# Patient Record
Sex: Male | Born: 1935 | Race: White | Hispanic: No | Smoking: Former smoker
Health system: Southern US, Community
[De-identification: ages and names within clinical notes are randomized; demographics above are authoritative.]

## PROBLEM LIST (undated history)

## (undated) DIAGNOSIS — C61 Malignant neoplasm of prostate: Secondary | ICD-10-CM

## (undated) DIAGNOSIS — I10 Essential (primary) hypertension: Secondary | ICD-10-CM

## (undated) DIAGNOSIS — I4891 Unspecified atrial fibrillation: Secondary | ICD-10-CM

## (undated) HISTORY — DX: Unspecified atrial fibrillation: I48.91

## (undated) HISTORY — PX: PROSTATECTOMY: SHX69

## (undated) HISTORY — DX: Essential (primary) hypertension: I10

## (undated) HISTORY — PX: HERNIA REPAIR: SHX51

## (undated) HISTORY — DX: Malignant neoplasm of prostate: C61

## (undated) HISTORY — PX: PROSTATE SURGERY: SHX751

---

## 2011-04-22 ENCOUNTER — Encounter (INDEPENDENT_AMBULATORY_CARE_PROVIDER_SITE_OTHER): Payer: Medicare Other | Admitting: Ophthalmology

## 2011-04-22 DIAGNOSIS — H348192 Central retinal vein occlusion, unspecified eye, stable: Secondary | ICD-10-CM

## 2011-04-22 DIAGNOSIS — H2510 Age-related nuclear cataract, unspecified eye: Secondary | ICD-10-CM

## 2011-04-22 DIAGNOSIS — H35039 Hypertensive retinopathy, unspecified eye: Secondary | ICD-10-CM

## 2011-04-22 DIAGNOSIS — H43819 Vitreous degeneration, unspecified eye: Secondary | ICD-10-CM

## 2011-06-17 ENCOUNTER — Ambulatory Visit (INDEPENDENT_AMBULATORY_CARE_PROVIDER_SITE_OTHER): Payer: Medicare Other | Admitting: Ophthalmology

## 2011-06-17 DIAGNOSIS — H35039 Hypertensive retinopathy, unspecified eye: Secondary | ICD-10-CM

## 2011-06-17 DIAGNOSIS — H348192 Central retinal vein occlusion, unspecified eye, stable: Secondary | ICD-10-CM

## 2011-06-17 DIAGNOSIS — H43819 Vitreous degeneration, unspecified eye: Secondary | ICD-10-CM

## 2011-06-17 DIAGNOSIS — H2510 Age-related nuclear cataract, unspecified eye: Secondary | ICD-10-CM

## 2011-09-09 ENCOUNTER — Encounter (INDEPENDENT_AMBULATORY_CARE_PROVIDER_SITE_OTHER): Payer: Medicare Other | Admitting: Ophthalmology

## 2011-09-09 DIAGNOSIS — H2510 Age-related nuclear cataract, unspecified eye: Secondary | ICD-10-CM

## 2011-09-09 DIAGNOSIS — H348192 Central retinal vein occlusion, unspecified eye, stable: Secondary | ICD-10-CM

## 2011-09-09 DIAGNOSIS — H43819 Vitreous degeneration, unspecified eye: Secondary | ICD-10-CM

## 2011-09-09 DIAGNOSIS — I10 Essential (primary) hypertension: Secondary | ICD-10-CM

## 2011-09-09 DIAGNOSIS — H35039 Hypertensive retinopathy, unspecified eye: Secondary | ICD-10-CM

## 2012-03-09 ENCOUNTER — Ambulatory Visit (INDEPENDENT_AMBULATORY_CARE_PROVIDER_SITE_OTHER): Payer: Medicare Other | Admitting: Ophthalmology

## 2014-10-31 DIAGNOSIS — H4011X1 Primary open-angle glaucoma, mild stage: Secondary | ICD-10-CM | POA: Diagnosis not present

## 2014-10-31 DIAGNOSIS — Z961 Presence of intraocular lens: Secondary | ICD-10-CM | POA: Diagnosis not present

## 2014-10-31 DIAGNOSIS — H4011X3 Primary open-angle glaucoma, severe stage: Secondary | ICD-10-CM | POA: Diagnosis not present

## 2014-11-14 DIAGNOSIS — L578 Other skin changes due to chronic exposure to nonionizing radiation: Secondary | ICD-10-CM | POA: Diagnosis not present

## 2014-11-14 DIAGNOSIS — L570 Actinic keratosis: Secondary | ICD-10-CM | POA: Diagnosis not present

## 2014-11-14 DIAGNOSIS — C44329 Squamous cell carcinoma of skin of other parts of face: Secondary | ICD-10-CM | POA: Diagnosis not present

## 2014-11-21 DIAGNOSIS — C44329 Squamous cell carcinoma of skin of other parts of face: Secondary | ICD-10-CM | POA: Diagnosis not present

## 2015-01-30 DIAGNOSIS — Z961 Presence of intraocular lens: Secondary | ICD-10-CM | POA: Diagnosis not present

## 2015-01-30 DIAGNOSIS — H4011X1 Primary open-angle glaucoma, mild stage: Secondary | ICD-10-CM | POA: Diagnosis not present

## 2015-01-30 DIAGNOSIS — H4011X3 Primary open-angle glaucoma, severe stage: Secondary | ICD-10-CM | POA: Diagnosis not present

## 2015-02-06 DIAGNOSIS — H4011X3 Primary open-angle glaucoma, severe stage: Secondary | ICD-10-CM | POA: Diagnosis not present

## 2015-02-06 DIAGNOSIS — H4089 Other specified glaucoma: Secondary | ICD-10-CM | POA: Diagnosis not present

## 2015-02-06 DIAGNOSIS — H4011X1 Primary open-angle glaucoma, mild stage: Secondary | ICD-10-CM | POA: Diagnosis not present

## 2015-02-07 DIAGNOSIS — E780 Pure hypercholesterolemia: Secondary | ICD-10-CM | POA: Diagnosis not present

## 2015-02-07 DIAGNOSIS — I4891 Unspecified atrial fibrillation: Secondary | ICD-10-CM | POA: Diagnosis not present

## 2015-02-07 DIAGNOSIS — Z79899 Other long term (current) drug therapy: Secondary | ICD-10-CM | POA: Diagnosis not present

## 2015-02-10 DIAGNOSIS — I4891 Unspecified atrial fibrillation: Secondary | ICD-10-CM | POA: Diagnosis not present

## 2015-02-14 DIAGNOSIS — I071 Rheumatic tricuspid insufficiency: Secondary | ICD-10-CM | POA: Diagnosis not present

## 2015-02-14 DIAGNOSIS — I340 Nonrheumatic mitral (valve) insufficiency: Secondary | ICD-10-CM | POA: Diagnosis not present

## 2015-02-14 DIAGNOSIS — I361 Nonrheumatic tricuspid (valve) insufficiency: Secondary | ICD-10-CM | POA: Diagnosis not present

## 2015-02-14 DIAGNOSIS — I351 Nonrheumatic aortic (valve) insufficiency: Secondary | ICD-10-CM | POA: Diagnosis not present

## 2015-02-14 DIAGNOSIS — I4891 Unspecified atrial fibrillation: Secondary | ICD-10-CM | POA: Diagnosis not present

## 2015-02-20 DIAGNOSIS — R972 Elevated prostate specific antigen [PSA]: Secondary | ICD-10-CM | POA: Diagnosis not present

## 2015-02-20 DIAGNOSIS — C61 Malignant neoplasm of prostate: Secondary | ICD-10-CM | POA: Diagnosis not present

## 2015-03-13 DIAGNOSIS — C44329 Squamous cell carcinoma of skin of other parts of face: Secondary | ICD-10-CM | POA: Diagnosis not present

## 2015-03-13 DIAGNOSIS — L570 Actinic keratosis: Secondary | ICD-10-CM | POA: Diagnosis not present

## 2015-03-13 DIAGNOSIS — L821 Other seborrheic keratosis: Secondary | ICD-10-CM | POA: Diagnosis not present

## 2015-03-13 DIAGNOSIS — I4891 Unspecified atrial fibrillation: Secondary | ICD-10-CM

## 2015-03-13 DIAGNOSIS — I10 Essential (primary) hypertension: Secondary | ICD-10-CM

## 2015-03-13 HISTORY — DX: Essential (primary) hypertension: I10

## 2015-03-13 HISTORY — DX: Unspecified atrial fibrillation: I48.91

## 2015-03-17 DIAGNOSIS — H4011X3 Primary open-angle glaucoma, severe stage: Secondary | ICD-10-CM | POA: Diagnosis not present

## 2015-03-17 DIAGNOSIS — H4089 Other specified glaucoma: Secondary | ICD-10-CM | POA: Diagnosis not present

## 2015-03-25 DIAGNOSIS — I10 Essential (primary) hypertension: Secondary | ICD-10-CM | POA: Diagnosis not present

## 2015-03-25 DIAGNOSIS — I4891 Unspecified atrial fibrillation: Secondary | ICD-10-CM | POA: Diagnosis not present

## 2015-03-31 DIAGNOSIS — I10 Essential (primary) hypertension: Secondary | ICD-10-CM | POA: Diagnosis not present

## 2015-03-31 DIAGNOSIS — I4891 Unspecified atrial fibrillation: Secondary | ICD-10-CM | POA: Diagnosis not present

## 2015-05-21 DIAGNOSIS — Z23 Encounter for immunization: Secondary | ICD-10-CM | POA: Diagnosis not present

## 2015-06-12 DIAGNOSIS — R972 Elevated prostate specific antigen [PSA]: Secondary | ICD-10-CM | POA: Diagnosis not present

## 2015-06-12 DIAGNOSIS — C61 Malignant neoplasm of prostate: Secondary | ICD-10-CM | POA: Diagnosis not present

## 2015-07-23 DIAGNOSIS — Z0000 Encounter for general adult medical examination without abnormal findings: Secondary | ICD-10-CM | POA: Diagnosis not present

## 2015-07-23 DIAGNOSIS — E785 Hyperlipidemia, unspecified: Secondary | ICD-10-CM | POA: Diagnosis not present

## 2015-08-01 DIAGNOSIS — Z1389 Encounter for screening for other disorder: Secondary | ICD-10-CM | POA: Diagnosis not present

## 2015-08-01 DIAGNOSIS — Z0000 Encounter for general adult medical examination without abnormal findings: Secondary | ICD-10-CM | POA: Diagnosis not present

## 2015-08-01 DIAGNOSIS — Z9181 History of falling: Secondary | ICD-10-CM | POA: Diagnosis not present

## 2015-08-07 DIAGNOSIS — Z961 Presence of intraocular lens: Secondary | ICD-10-CM | POA: Diagnosis not present

## 2015-08-07 DIAGNOSIS — H401123 Primary open-angle glaucoma, left eye, severe stage: Secondary | ICD-10-CM | POA: Diagnosis not present

## 2015-08-07 DIAGNOSIS — H401111 Primary open-angle glaucoma, right eye, mild stage: Secondary | ICD-10-CM | POA: Diagnosis not present

## 2017-03-24 ENCOUNTER — Ambulatory Visit (INDEPENDENT_AMBULATORY_CARE_PROVIDER_SITE_OTHER): Payer: Medicare Other | Admitting: Cardiology

## 2017-03-24 ENCOUNTER — Encounter: Payer: Self-pay | Admitting: Cardiology

## 2017-03-24 VITALS — BP 110/66 | HR 69 | Ht 70.0 in | Wt 172.4 lb

## 2017-03-24 DIAGNOSIS — I10 Essential (primary) hypertension: Secondary | ICD-10-CM | POA: Diagnosis not present

## 2017-03-24 DIAGNOSIS — I482 Chronic atrial fibrillation, unspecified: Secondary | ICD-10-CM

## 2017-03-24 NOTE — Progress Notes (Signed)
Cardiology Office Note:    Date:  03/24/2017   ID:  Justin Hull, DOB 12-07-1929, MRN [id number]  PCP:  Street, Sharon Mt, MD  Cardiologist:  Jenean Lindau, MD   Referring MD: [address] Sharon Mt, *    ASSESSMENT:    1. Chronic atrial fibrillation ([place])   2. Essential hypertension    PLAN:    In order of problems listed above:  1. I discussed with the patient atrial fibrillation, disease process. Management and therapy including rate and rhythm control, anticoagulation benefits and potential risks were discussed extensively with the patient. Patient had multiple questions which were answered to patient's satisfaction. 2. Patient and his arrhythmia condition is stable with the patient is asymptomatic with excellent effort tolerance therefore we will not make any changes during this visit. 3. His blood pressure stable. His lipids and by his primary care physician. Diet was discussed he will be seen in follow-up appointment in 6 months or earlier if he has any concerns.   Medication Adjustments/Labs and Tests Ordered: Current medicines are reviewed at length with the patient today.  Concerns regarding medicines are outlined above.  No orders of the defined types were placed in this encounter.  No orders of the defined types were placed in this encounter.    History of Present Illness:    Justin Hull is a 83 y.o. male who is being seen today for the evaluation of Atrial fibrillation at the request of Street, Sharon Mt, *. The patient has been evaluated and taken care of by me in my previous practice and he has transferred his care and wants to be established with this practice. He is an amazingly healthy for his age is a fit gentleman who appears younger than stated age. He denies any chest pain orthopnea or PND. No palpitations or any dizziness his exercise capacity is excellent and at bedtime atrial fibrillation is asymptomatic. At the time of my evaluation is alert awake  oriented and in no distress  Past Medical History:  Diagnosis Date  . Atrial fibrillation ([place]) 03/13/2015  . Essential hypertension 03/13/2015    Past Surgical History:  Procedure Laterality Date  . PROSTATE SURGERY      Current Medications: Current Meds  Medication Sig  . amLODipine (NORVASC) 2.5 MG tablet Take 2.5 mg by mouth daily.  Marland Kitchen apixaban (ELIQUIS) 5 MG TABS tablet Take 5 mg by mouth 2 (two) times daily.  . benazepril (LOTENSIN) 40 MG tablet Take 40 mg by mouth daily.  . bicalutamide (CASODEX) 50 MG tablet Take 50 mg by mouth daily.  . Cholecalciferol (VITAMIN D3) 1000 units CAPS Take 1,000 Units by mouth daily.  . COMBIGAN 0.2-0.5 % ophthalmic solution instil 1 drop in right eye twice a day  . prednisoLONE acetate (PRED FORTE) 1 % ophthalmic suspension Administer 1 drop into the left eye daily at 0600.  Marland Kitchen vitamin E 100 UNIT capsule Take 100 Units by mouth daily.     Allergies:   Patient has no known allergies.   Social History   Social History  . Marital status: Unknown    Spouse name: N/A  . Number of children: N/A  . Years of education: N/A   Social History Main Topics  . Smoking status: Current Some Day Smoker    Types: Cigars  . Smokeless tobacco: Former Systems developer     Comment: smokes cigars when playing golf  . Alcohol use 0.6 oz/week    1 Cans of beer per week  .  Drug use: No  . Sexual activity: Not Asked   Other Topics Concern  . None   Social History Narrative  . None     Family History: The patient's family history includes Heart attack in his brother and father; Heart disease in his brother.  ROS:   Please see the history of present illness.    All other systems reviewed and are negative.  EKGs/Labs/Other Studies Reviewed:    The following studies were reviewed today: I reviewed her records from previous office visits and discussed and answered his questions. EKG reveals atrial fibrillation with well-controlled ventricular rate and  nonspecific ST-T changes.   Recent Labs: No results found for requested labs within last 8760 hours.  Recent Lipid Panel No results found for: CHOL, TRIG, HDL, CHOLHDL, VLDL, LDLCALC, LDLDIRECT  Physical Exam:    VS:  BP 110/66   Pulse 69   Ht 5\' 10"  (1.778 m)   Wt 172 lb 6.4 oz (78.2 kg)   SpO2 95%   BMI 24.74 kg/m     Wt Readings from Last 3 Encounters:  03/24/17 172 lb 6.4 oz (78.2 kg)     GEN: Patient is in no acute distress HEENT: Normal NECK: No JVD; No carotid bruits LYMPHATICS: No lymphadenopathy CARDIAC: S1 S2 regular, 2/6 systolic murmur at the apex. RESPIRATORY:  Clear to auscultation without rales, wheezing or rhonchi  ABDOMEN: Soft, non-tender, non-distended MUSCULOSKELETAL:  No edema; No deformity  SKIN: Warm and dry NEUROLOGIC:  Alert and oriented x 3 PSYCHIATRIC:  Normal affect    Signed, Jenean Lindau, MD  03/24/2017 9:11 AM    [place]

## 2017-04-14 DIAGNOSIS — Z961 Presence of intraocular lens: Secondary | ICD-10-CM | POA: Diagnosis not present

## 2017-04-14 DIAGNOSIS — H401123 Primary open-angle glaucoma, left eye, severe stage: Secondary | ICD-10-CM | POA: Diagnosis not present

## 2017-04-14 DIAGNOSIS — H401111 Primary open-angle glaucoma, right eye, mild stage: Secondary | ICD-10-CM | POA: Diagnosis not present

## 2017-06-08 DIAGNOSIS — C61 Malignant neoplasm of prostate: Secondary | ICD-10-CM | POA: Diagnosis not present

## 2017-06-08 DIAGNOSIS — N3289 Other specified disorders of bladder: Secondary | ICD-10-CM | POA: Diagnosis not present

## 2017-06-08 DIAGNOSIS — Z7689 Persons encountering health services in other specified circumstances: Secondary | ICD-10-CM | POA: Diagnosis not present

## 2017-06-15 DIAGNOSIS — D485 Neoplasm of uncertain behavior of skin: Secondary | ICD-10-CM | POA: Diagnosis not present

## 2017-06-15 DIAGNOSIS — L578 Other skin changes due to chronic exposure to nonionizing radiation: Secondary | ICD-10-CM | POA: Diagnosis not present

## 2017-06-15 DIAGNOSIS — L570 Actinic keratosis: Secondary | ICD-10-CM | POA: Diagnosis not present

## 2017-06-15 DIAGNOSIS — L821 Other seborrheic keratosis: Secondary | ICD-10-CM | POA: Diagnosis not present

## 2017-06-27 DIAGNOSIS — Z23 Encounter for immunization: Secondary | ICD-10-CM | POA: Diagnosis not present

## 2017-08-19 DIAGNOSIS — E785 Hyperlipidemia, unspecified: Secondary | ICD-10-CM | POA: Diagnosis not present

## 2017-08-19 DIAGNOSIS — I129 Hypertensive chronic kidney disease with stage 1 through stage 4 chronic kidney disease, or unspecified chronic kidney disease: Secondary | ICD-10-CM | POA: Diagnosis not present

## 2017-08-19 DIAGNOSIS — R739 Hyperglycemia, unspecified: Secondary | ICD-10-CM | POA: Diagnosis not present

## 2017-08-19 DIAGNOSIS — Z0000 Encounter for general adult medical examination without abnormal findings: Secondary | ICD-10-CM | POA: Diagnosis not present

## 2017-08-19 DIAGNOSIS — I10 Essential (primary) hypertension: Secondary | ICD-10-CM | POA: Diagnosis not present

## 2017-08-30 HISTORY — PX: BUCCAL MASS EXCISION: SHX1272

## 2017-10-26 DIAGNOSIS — H401123 Primary open-angle glaucoma, left eye, severe stage: Secondary | ICD-10-CM | POA: Diagnosis not present

## 2017-10-26 DIAGNOSIS — H401111 Primary open-angle glaucoma, right eye, mild stage: Secondary | ICD-10-CM | POA: Diagnosis not present

## 2017-10-26 DIAGNOSIS — Z961 Presence of intraocular lens: Secondary | ICD-10-CM | POA: Diagnosis not present

## 2017-12-08 DIAGNOSIS — N3289 Other specified disorders of bladder: Secondary | ICD-10-CM | POA: Diagnosis not present

## 2017-12-08 DIAGNOSIS — C61 Malignant neoplasm of prostate: Secondary | ICD-10-CM | POA: Diagnosis not present

## 2018-04-27 DIAGNOSIS — H40011 Open angle with borderline findings, low risk, right eye: Secondary | ICD-10-CM | POA: Diagnosis not present

## 2018-04-27 DIAGNOSIS — H401123 Primary open-angle glaucoma, left eye, severe stage: Secondary | ICD-10-CM | POA: Diagnosis not present

## 2018-04-27 DIAGNOSIS — Z961 Presence of intraocular lens: Secondary | ICD-10-CM | POA: Diagnosis not present

## 2018-06-08 DIAGNOSIS — N3289 Other specified disorders of bladder: Secondary | ICD-10-CM | POA: Diagnosis not present

## 2018-06-16 DIAGNOSIS — K122 Cellulitis and abscess of mouth: Secondary | ICD-10-CM | POA: Diagnosis not present

## 2018-06-28 DIAGNOSIS — K122 Cellulitis and abscess of mouth: Secondary | ICD-10-CM | POA: Diagnosis not present

## 2018-06-28 DIAGNOSIS — Z6824 Body mass index (BMI) 24.0-24.9, adult: Secondary | ICD-10-CM | POA: Diagnosis not present

## 2018-07-06 DIAGNOSIS — C090 Malignant neoplasm of tonsillar fossa: Secondary | ICD-10-CM | POA: Diagnosis not present

## 2018-07-10 DIAGNOSIS — C060 Malignant neoplasm of cheek mucosa: Secondary | ICD-10-CM | POA: Diagnosis not present

## 2018-07-11 DIAGNOSIS — C060 Malignant neoplasm of cheek mucosa: Secondary | ICD-10-CM | POA: Diagnosis not present

## 2018-07-11 DIAGNOSIS — I482 Chronic atrial fibrillation, unspecified: Secondary | ICD-10-CM | POA: Diagnosis not present

## 2018-07-11 DIAGNOSIS — R490 Dysphonia: Secondary | ICD-10-CM | POA: Diagnosis not present

## 2018-07-12 DIAGNOSIS — C090 Malignant neoplasm of tonsillar fossa: Secondary | ICD-10-CM | POA: Diagnosis not present

## 2018-07-19 DIAGNOSIS — Z01810 Encounter for preprocedural cardiovascular examination: Secondary | ICD-10-CM | POA: Diagnosis not present

## 2018-07-19 DIAGNOSIS — I4891 Unspecified atrial fibrillation: Secondary | ICD-10-CM | POA: Diagnosis not present

## 2018-07-24 DIAGNOSIS — C060 Malignant neoplasm of cheek mucosa: Secondary | ICD-10-CM | POA: Diagnosis not present

## 2018-07-24 DIAGNOSIS — C770 Secondary and unspecified malignant neoplasm of lymph nodes of head, face and neck: Secondary | ICD-10-CM | POA: Diagnosis not present

## 2018-07-24 DIAGNOSIS — I482 Chronic atrial fibrillation, unspecified: Secondary | ICD-10-CM | POA: Diagnosis not present

## 2018-08-10 DIAGNOSIS — C7989 Secondary malignant neoplasm of other specified sites: Secondary | ICD-10-CM | POA: Diagnosis not present

## 2018-08-10 DIAGNOSIS — C44329 Squamous cell carcinoma of skin of other parts of face: Secondary | ICD-10-CM | POA: Diagnosis not present

## 2018-08-10 DIAGNOSIS — C069 Malignant neoplasm of mouth, unspecified: Secondary | ICD-10-CM | POA: Diagnosis not present

## 2018-08-10 DIAGNOSIS — Z7901 Long term (current) use of anticoagulants: Secondary | ICD-10-CM | POA: Diagnosis not present

## 2018-08-10 DIAGNOSIS — I10 Essential (primary) hypertension: Secondary | ICD-10-CM | POA: Diagnosis not present

## 2018-08-10 DIAGNOSIS — M7989 Other specified soft tissue disorders: Secondary | ICD-10-CM | POA: Diagnosis not present

## 2018-08-10 DIAGNOSIS — R252 Cramp and spasm: Secondary | ICD-10-CM | POA: Diagnosis not present

## 2018-08-10 DIAGNOSIS — C770 Secondary and unspecified malignant neoplasm of lymph nodes of head, face and neck: Secondary | ICD-10-CM | POA: Diagnosis not present

## 2018-08-10 DIAGNOSIS — M79662 Pain in left lower leg: Secondary | ICD-10-CM | POA: Diagnosis not present

## 2018-08-10 DIAGNOSIS — H5462 Unqualified visual loss, left eye, normal vision right eye: Secondary | ICD-10-CM | POA: Diagnosis not present

## 2018-08-10 DIAGNOSIS — K0889 Other specified disorders of teeth and supporting structures: Secondary | ICD-10-CM | POA: Diagnosis not present

## 2018-08-10 DIAGNOSIS — I482 Chronic atrial fibrillation, unspecified: Secondary | ICD-10-CM | POA: Diagnosis not present

## 2018-08-10 DIAGNOSIS — Z87891 Personal history of nicotine dependence: Secondary | ICD-10-CM | POA: Diagnosis not present

## 2018-08-10 DIAGNOSIS — C792 Secondary malignant neoplasm of skin: Secondary | ICD-10-CM | POA: Diagnosis not present

## 2018-08-10 DIAGNOSIS — R05 Cough: Secondary | ICD-10-CM | POA: Diagnosis not present

## 2018-08-10 DIAGNOSIS — K05322 Chronic periodontitis, generalized, moderate: Secondary | ICD-10-CM | POA: Diagnosis not present

## 2018-08-10 DIAGNOSIS — C060 Malignant neoplasm of cheek mucosa: Secondary | ICD-10-CM | POA: Diagnosis not present

## 2018-08-10 DIAGNOSIS — Z79899 Other long term (current) drug therapy: Secondary | ICD-10-CM | POA: Diagnosis not present

## 2018-08-10 DIAGNOSIS — R1314 Dysphagia, pharyngoesophageal phase: Secondary | ICD-10-CM | POA: Diagnosis not present

## 2018-09-06 DIAGNOSIS — I129 Hypertensive chronic kidney disease with stage 1 through stage 4 chronic kidney disease, or unspecified chronic kidney disease: Secondary | ICD-10-CM | POA: Diagnosis not present

## 2018-09-06 DIAGNOSIS — E785 Hyperlipidemia, unspecified: Secondary | ICD-10-CM | POA: Diagnosis not present

## 2018-09-06 DIAGNOSIS — I10 Essential (primary) hypertension: Secondary | ICD-10-CM | POA: Diagnosis not present

## 2018-09-06 DIAGNOSIS — C069 Malignant neoplasm of mouth, unspecified: Secondary | ICD-10-CM | POA: Diagnosis not present

## 2018-09-06 DIAGNOSIS — Z0000 Encounter for general adult medical examination without abnormal findings: Secondary | ICD-10-CM | POA: Diagnosis not present

## 2018-09-06 DIAGNOSIS — Z79899 Other long term (current) drug therapy: Secondary | ICD-10-CM | POA: Diagnosis not present

## 2018-09-27 DIAGNOSIS — C060 Malignant neoplasm of cheek mucosa: Secondary | ICD-10-CM | POA: Diagnosis not present

## 2018-11-01 DIAGNOSIS — H401123 Primary open-angle glaucoma, left eye, severe stage: Secondary | ICD-10-CM | POA: Diagnosis not present

## 2018-11-01 DIAGNOSIS — Z961 Presence of intraocular lens: Secondary | ICD-10-CM | POA: Diagnosis not present

## 2018-11-01 DIAGNOSIS — H02834 Dermatochalasis of left upper eyelid: Secondary | ICD-10-CM | POA: Diagnosis not present

## 2018-11-01 DIAGNOSIS — H401111 Primary open-angle glaucoma, right eye, mild stage: Secondary | ICD-10-CM | POA: Diagnosis not present

## 2018-11-01 DIAGNOSIS — H02831 Dermatochalasis of right upper eyelid: Secondary | ICD-10-CM | POA: Diagnosis not present

## 2019-01-12 DIAGNOSIS — C770 Secondary and unspecified malignant neoplasm of lymph nodes of head, face and neck: Secondary | ICD-10-CM | POA: Diagnosis not present

## 2019-01-12 DIAGNOSIS — C060 Malignant neoplasm of cheek mucosa: Secondary | ICD-10-CM | POA: Diagnosis not present

## 2019-01-18 DIAGNOSIS — Z7689 Persons encountering health services in other specified circumstances: Secondary | ICD-10-CM | POA: Diagnosis not present

## 2019-01-18 DIAGNOSIS — N3289 Other specified disorders of bladder: Secondary | ICD-10-CM | POA: Diagnosis not present

## 2019-04-30 DIAGNOSIS — I129 Hypertensive chronic kidney disease with stage 1 through stage 4 chronic kidney disease, or unspecified chronic kidney disease: Secondary | ICD-10-CM | POA: Diagnosis not present

## 2019-04-30 DIAGNOSIS — I10 Essential (primary) hypertension: Secondary | ICD-10-CM | POA: Diagnosis not present

## 2019-04-30 DIAGNOSIS — E785 Hyperlipidemia, unspecified: Secondary | ICD-10-CM | POA: Diagnosis not present

## 2019-05-02 DIAGNOSIS — H401123 Primary open-angle glaucoma, left eye, severe stage: Secondary | ICD-10-CM | POA: Diagnosis not present

## 2019-05-02 DIAGNOSIS — H401111 Primary open-angle glaucoma, right eye, mild stage: Secondary | ICD-10-CM | POA: Diagnosis not present

## 2019-05-30 DIAGNOSIS — E785 Hyperlipidemia, unspecified: Secondary | ICD-10-CM | POA: Diagnosis not present

## 2019-05-30 DIAGNOSIS — H401111 Primary open-angle glaucoma, right eye, mild stage: Secondary | ICD-10-CM | POA: Diagnosis not present

## 2019-05-30 DIAGNOSIS — I129 Hypertensive chronic kidney disease with stage 1 through stage 4 chronic kidney disease, or unspecified chronic kidney disease: Secondary | ICD-10-CM | POA: Diagnosis not present

## 2019-05-30 DIAGNOSIS — I10 Essential (primary) hypertension: Secondary | ICD-10-CM | POA: Diagnosis not present

## 2019-05-30 DIAGNOSIS — H401123 Primary open-angle glaucoma, left eye, severe stage: Secondary | ICD-10-CM | POA: Diagnosis not present

## 2019-07-23 DIAGNOSIS — Z79899 Other long term (current) drug therapy: Secondary | ICD-10-CM | POA: Diagnosis not present

## 2019-07-24 DIAGNOSIS — Z23 Encounter for immunization: Secondary | ICD-10-CM | POA: Diagnosis not present

## 2019-09-10 DIAGNOSIS — E785 Hyperlipidemia, unspecified: Secondary | ICD-10-CM | POA: Diagnosis not present

## 2019-09-10 DIAGNOSIS — R739 Hyperglycemia, unspecified: Secondary | ICD-10-CM | POA: Diagnosis not present

## 2019-09-10 DIAGNOSIS — I129 Hypertensive chronic kidney disease with stage 1 through stage 4 chronic kidney disease, or unspecified chronic kidney disease: Secondary | ICD-10-CM | POA: Diagnosis not present

## 2019-09-10 DIAGNOSIS — Z0000 Encounter for general adult medical examination without abnormal findings: Secondary | ICD-10-CM | POA: Diagnosis not present

## 2019-09-10 DIAGNOSIS — I4891 Unspecified atrial fibrillation: Secondary | ICD-10-CM | POA: Diagnosis not present

## 2019-09-10 DIAGNOSIS — Z79899 Other long term (current) drug therapy: Secondary | ICD-10-CM | POA: Diagnosis not present

## 2019-09-10 DIAGNOSIS — I10 Essential (primary) hypertension: Secondary | ICD-10-CM | POA: Diagnosis not present

## 2019-11-27 DIAGNOSIS — H401123 Primary open-angle glaucoma, left eye, severe stage: Secondary | ICD-10-CM | POA: Diagnosis not present

## 2019-11-27 DIAGNOSIS — H401111 Primary open-angle glaucoma, right eye, mild stage: Secondary | ICD-10-CM | POA: Diagnosis not present

## 2020-01-21 DIAGNOSIS — Z79899 Other long term (current) drug therapy: Secondary | ICD-10-CM | POA: Diagnosis not present

## 2020-03-10 DIAGNOSIS — D6869 Other thrombophilia: Secondary | ICD-10-CM | POA: Diagnosis not present

## 2020-03-10 DIAGNOSIS — I10 Essential (primary) hypertension: Secondary | ICD-10-CM | POA: Diagnosis not present

## 2020-03-10 DIAGNOSIS — E785 Hyperlipidemia, unspecified: Secondary | ICD-10-CM | POA: Diagnosis not present

## 2020-03-10 DIAGNOSIS — I4891 Unspecified atrial fibrillation: Secondary | ICD-10-CM | POA: Diagnosis not present

## 2020-04-21 DIAGNOSIS — R310 Gross hematuria: Secondary | ICD-10-CM | POA: Diagnosis not present

## 2020-04-25 DIAGNOSIS — M25522 Pain in left elbow: Secondary | ICD-10-CM | POA: Diagnosis not present

## 2020-04-25 DIAGNOSIS — I2510 Atherosclerotic heart disease of native coronary artery without angina pectoris: Secondary | ICD-10-CM | POA: Diagnosis not present

## 2020-04-25 DIAGNOSIS — K8020 Calculus of gallbladder without cholecystitis without obstruction: Secondary | ICD-10-CM | POA: Diagnosis not present

## 2020-04-25 DIAGNOSIS — N281 Cyst of kidney, acquired: Secondary | ICD-10-CM | POA: Diagnosis not present

## 2020-04-25 DIAGNOSIS — R310 Gross hematuria: Secondary | ICD-10-CM | POA: Diagnosis not present

## 2020-04-25 DIAGNOSIS — I700 Atherosclerosis of aorta: Secondary | ICD-10-CM | POA: Diagnosis not present

## 2020-05-26 DIAGNOSIS — R310 Gross hematuria: Secondary | ICD-10-CM | POA: Diagnosis not present

## 2020-05-28 DIAGNOSIS — H401123 Primary open-angle glaucoma, left eye, severe stage: Secondary | ICD-10-CM | POA: Diagnosis not present

## 2020-05-28 DIAGNOSIS — Z961 Presence of intraocular lens: Secondary | ICD-10-CM | POA: Diagnosis not present

## 2020-05-28 DIAGNOSIS — H401111 Primary open-angle glaucoma, right eye, mild stage: Secondary | ICD-10-CM | POA: Diagnosis not present

## 2020-05-29 ENCOUNTER — Other Ambulatory Visit (HOSPITAL_COMMUNITY): Payer: Self-pay | Admitting: Urology

## 2020-05-29 DIAGNOSIS — C61 Malignant neoplasm of prostate: Secondary | ICD-10-CM

## 2020-06-16 ENCOUNTER — Ambulatory Visit (HOSPITAL_COMMUNITY)
Admission: RE | Admit: 2020-06-16 | Discharge: 2020-06-16 | Disposition: A | Discharge status: Home / Self Care | Payer: Medicare Other | Source: Ambulatory Visit | Attending: Urology | Admitting: Urology

## 2020-06-16 ENCOUNTER — Other Ambulatory Visit: Payer: Self-pay

## 2020-06-16 DIAGNOSIS — C61 Malignant neoplasm of prostate: Secondary | ICD-10-CM | POA: Diagnosis present

## 2020-06-16 MED ORDER — PIFLIFOLASTAT F 18 (PYLARIFY) INJECTION
9.0000 | Freq: Once | INTRAVENOUS | Status: AC
  Administered 2020-06-16: 9.3 via INTRAVENOUS

## 2020-07-02 DIAGNOSIS — R310 Gross hematuria: Secondary | ICD-10-CM | POA: Diagnosis not present

## 2020-07-02 DIAGNOSIS — N320 Bladder-neck obstruction: Secondary | ICD-10-CM | POA: Diagnosis not present

## 2020-09-03 DIAGNOSIS — R310 Gross hematuria: Secondary | ICD-10-CM | POA: Diagnosis not present

## 2020-09-18 DIAGNOSIS — I10 Essential (primary) hypertension: Secondary | ICD-10-CM | POA: Diagnosis not present

## 2020-09-18 DIAGNOSIS — I4891 Unspecified atrial fibrillation: Secondary | ICD-10-CM | POA: Diagnosis not present

## 2020-09-18 DIAGNOSIS — E785 Hyperlipidemia, unspecified: Secondary | ICD-10-CM | POA: Diagnosis not present

## 2020-09-18 DIAGNOSIS — Z23 Encounter for immunization: Secondary | ICD-10-CM | POA: Diagnosis not present

## 2020-09-18 DIAGNOSIS — D6869 Other thrombophilia: Secondary | ICD-10-CM | POA: Diagnosis not present

## 2020-09-18 DIAGNOSIS — Z0000 Encounter for general adult medical examination without abnormal findings: Secondary | ICD-10-CM | POA: Diagnosis not present

## 2020-09-18 DIAGNOSIS — C069 Malignant neoplasm of mouth, unspecified: Secondary | ICD-10-CM | POA: Diagnosis not present

## 2020-09-18 DIAGNOSIS — N1831 Chronic kidney disease, stage 3a: Secondary | ICD-10-CM | POA: Diagnosis not present

## 2020-09-18 DIAGNOSIS — Z79899 Other long term (current) drug therapy: Secondary | ICD-10-CM | POA: Diagnosis not present

## 2020-09-18 DIAGNOSIS — R7302 Impaired glucose tolerance (oral): Secondary | ICD-10-CM | POA: Diagnosis not present

## 2020-11-25 DIAGNOSIS — H401111 Primary open-angle glaucoma, right eye, mild stage: Secondary | ICD-10-CM | POA: Diagnosis not present

## 2020-11-25 DIAGNOSIS — H401123 Primary open-angle glaucoma, left eye, severe stage: Secondary | ICD-10-CM | POA: Diagnosis not present

## 2020-11-25 DIAGNOSIS — Z961 Presence of intraocular lens: Secondary | ICD-10-CM | POA: Diagnosis not present

## 2021-11-12 DIAGNOSIS — R739 Hyperglycemia, unspecified: Secondary | ICD-10-CM | POA: Diagnosis not present

## 2021-11-12 DIAGNOSIS — E785 Hyperlipidemia, unspecified: Secondary | ICD-10-CM | POA: Diagnosis not present

## 2021-11-16 DIAGNOSIS — Z6824 Body mass index (BMI) 24.0-24.9, adult: Secondary | ICD-10-CM | POA: Diagnosis not present

## 2021-11-16 DIAGNOSIS — N1831 Chronic kidney disease, stage 3a: Secondary | ICD-10-CM | POA: Diagnosis not present

## 2021-11-16 DIAGNOSIS — C069 Malignant neoplasm of mouth, unspecified: Secondary | ICD-10-CM | POA: Diagnosis not present

## 2021-11-16 DIAGNOSIS — D6869 Other thrombophilia: Secondary | ICD-10-CM | POA: Diagnosis not present

## 2021-11-16 DIAGNOSIS — I10 Essential (primary) hypertension: Secondary | ICD-10-CM | POA: Diagnosis not present

## 2021-11-16 DIAGNOSIS — I4891 Unspecified atrial fibrillation: Secondary | ICD-10-CM | POA: Diagnosis not present

## 2021-11-16 DIAGNOSIS — Z0000 Encounter for general adult medical examination without abnormal findings: Secondary | ICD-10-CM | POA: Diagnosis not present

## 2021-11-16 DIAGNOSIS — Z9181 History of falling: Secondary | ICD-10-CM | POA: Diagnosis not present

## 2021-12-15 DIAGNOSIS — H401123 Primary open-angle glaucoma, left eye, severe stage: Secondary | ICD-10-CM | POA: Diagnosis not present

## 2022-05-10 IMAGING — CT NM PET TUM IMG SKULL BASE T - THIGH
7 series · 25 of 25 positions shown · non-contrast
Comparison: CT abdomen 04/25/2020

CLINICAL DATA: Prostate carcinoma with biochemical recurrence.

EXAM:
NUCLEAR MEDICINE PET SKULL BASE TO THIGH
TECHNIQUE: 9.3 mCi F18 Piflufolastat was injected intravenously. Full-ring PET
imaging was performed from the skull base to thigh after the
radiotracer. CT data was obtained and used for attenuation
correction and anatomic localization.

[Series 3: pet sk_thigh ac · axial · 5.0mm · 4.07mm/px · z∈[-977,-121]mm · 5 of 215 slices shown]
[im 1/215]
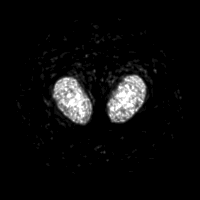
[im 54/215]
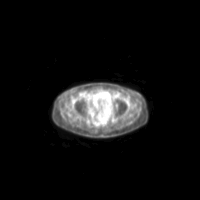
[im 108/215]
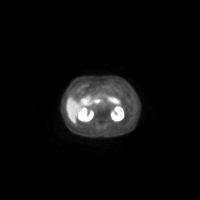
[im 161/215]
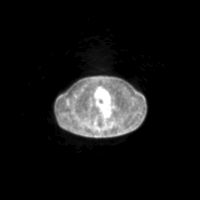
[im 215/215]
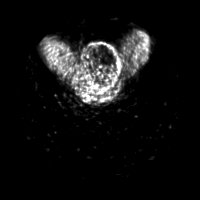

[Series 4: ct sk_thigh 5.0 bf37 · axial · 5.0mm · 0.98mm/px · z∈[-977,-121]mm · 5 of 214 slices shown]
[im 1/214]
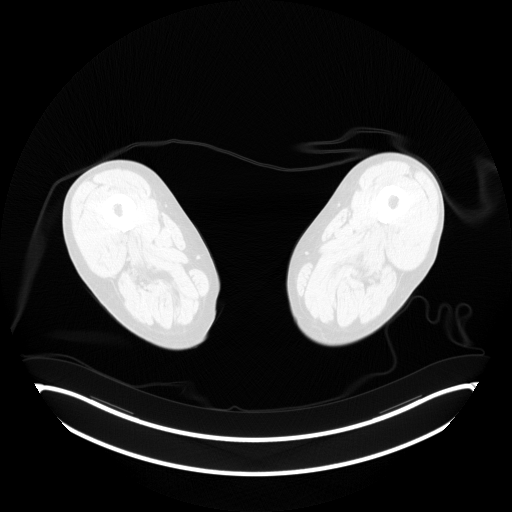
[im 54/214]
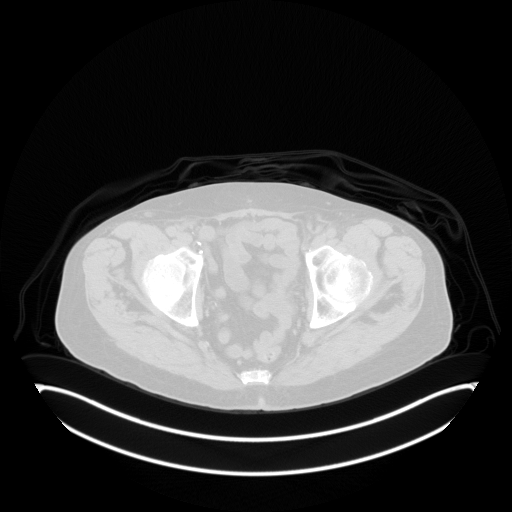
[im 107/214]
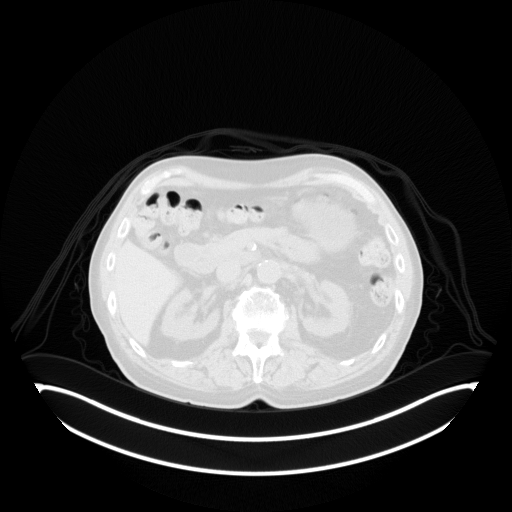
[im 160/214]
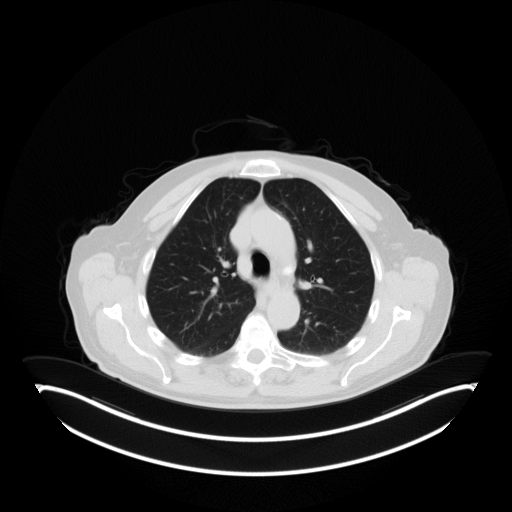
[im 214/214  brain]
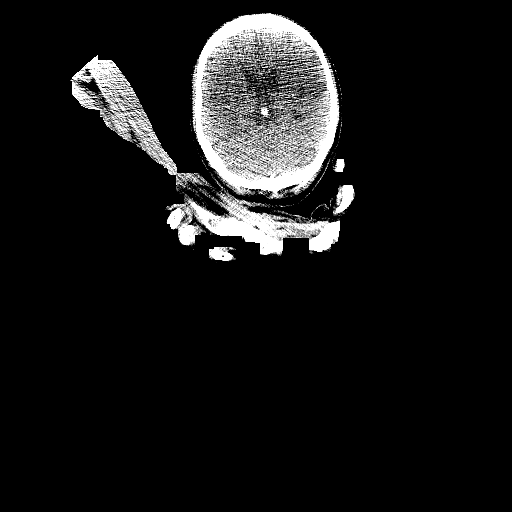

[Series 5: pet sk_thigh nac · axial · 5.0mm · 4.07mm/px · z∈[-977,-121]mm · 5 of 215 slices shown]
[im 1/215]
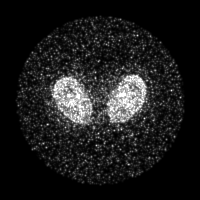
[im 54/215]
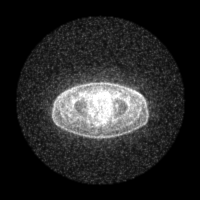
[im 108/215]
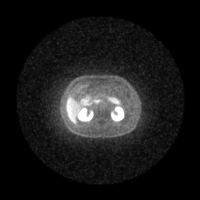
[im 161/215]
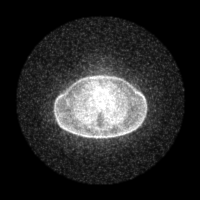
[im 215/215]
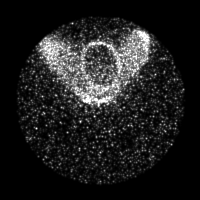

[Series 8: ct sk_thigh 5.0 br59 (id)_bone · axial · 5.0mm · 0.71mm/px · z∈[-513,-233]mm · 2 of 71 slices shown]
[im 1/71]
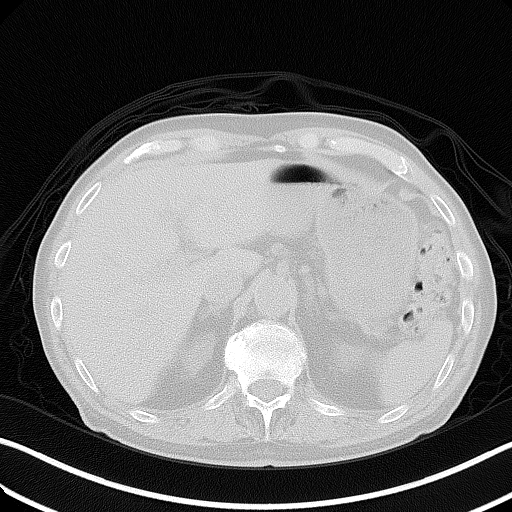
[im 71/71]
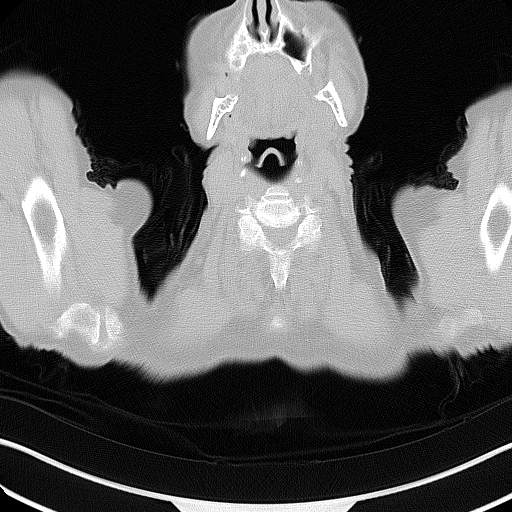

[Series 603: <mip collection> · coronal · 1.78mm/px · 1 of 32 slices shown]
[im 1/32]
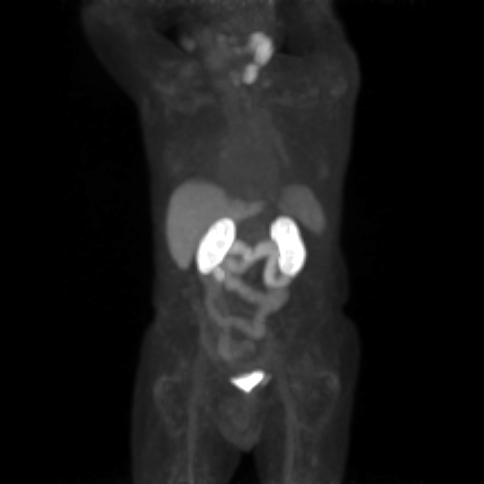

[Series 604: fused cor · 2 of 76 slices shown]
[im 1/76]
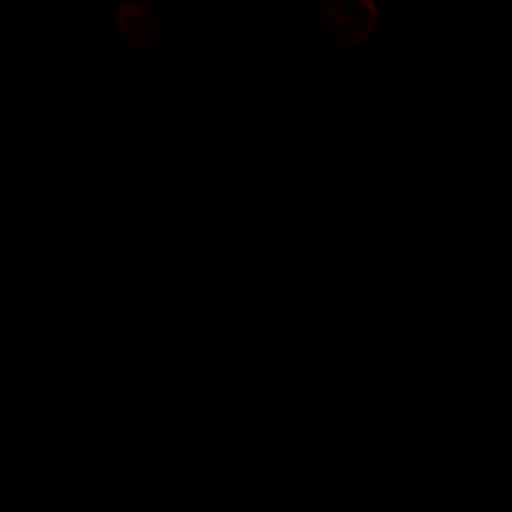
[im 76/76]
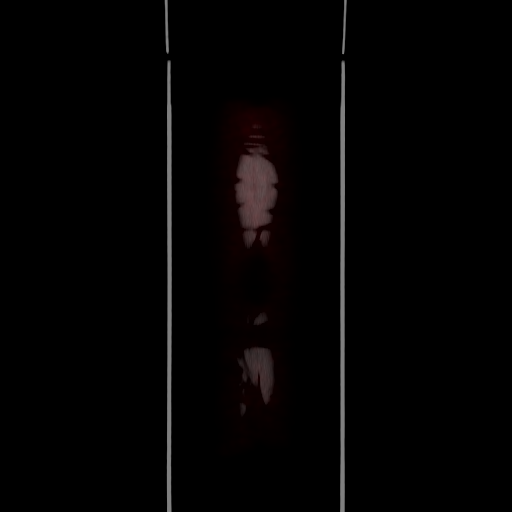

[Series 605: range-ct sk_thigh 5.0 bf37-tra-<alpha range> · 5 of 199 slices shown]
[im 1/199]
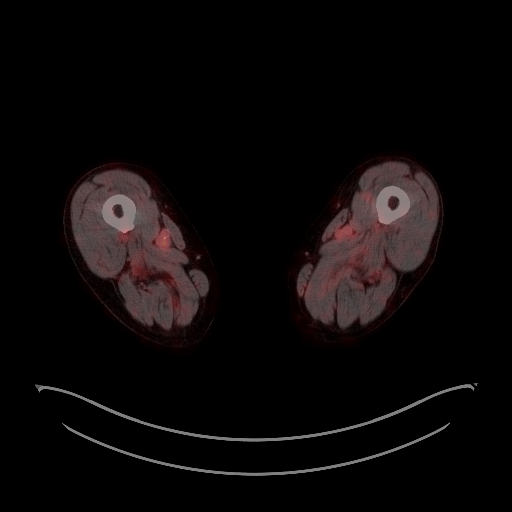
[im 50/199]
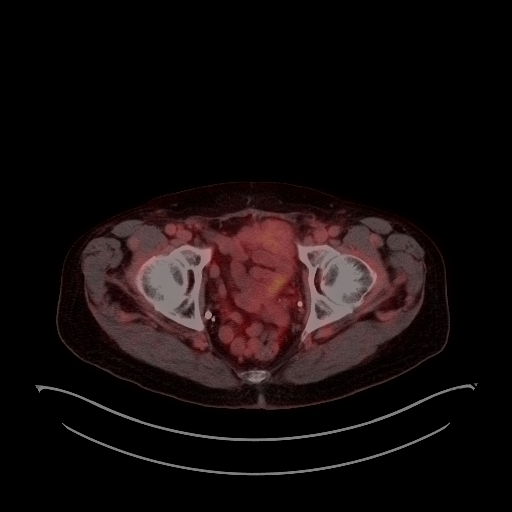
[im 100/199]
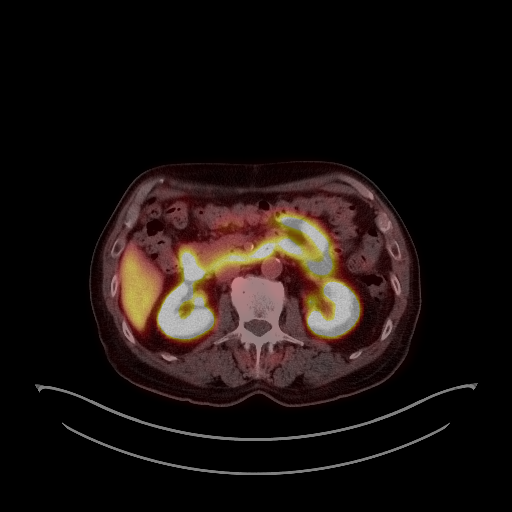
[im 149/199]
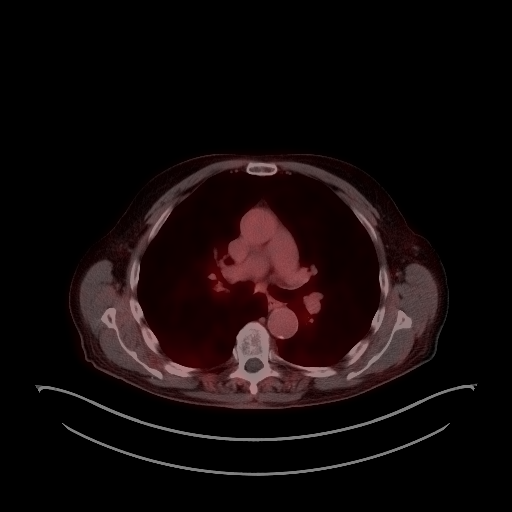
[im 199/199]
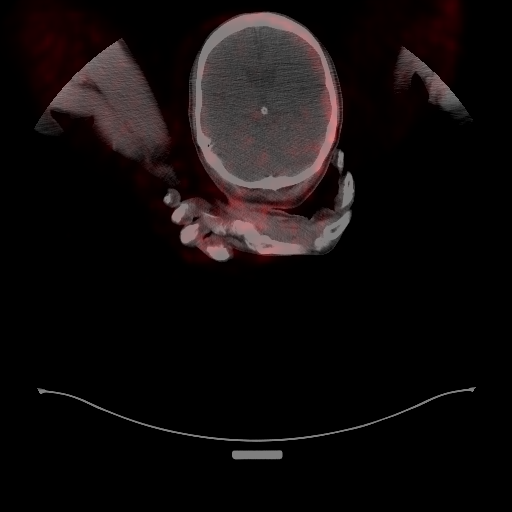

[25 of 25 positions shown; findings below may reference images not displayed]

FINDINGS: NECK

No radiotracer activity in neck lymph nodes. Intense activity
associated with the LEFT muscles mastication is considered benign.

Incidental CT finding: None

CHEST

No radiotracer accumulation within mediastinal or hilar lymph nodes.
No suspicious pulmonary nodules on the CT scan.

Incidental CT finding: None

ABDOMEN/PELVIS

Prostate: Post prostatectomy. In LEFT aspect of the prostatectomy
bed, posterior to the bladder and adjacent to the left operator
space, there is a soft tissue mass measuring 1.9 x 1.7 cm (image
168/series 4) with intense radiotracer activity (SUV max equal 7.1).
No additional activity the prostate bed. No

Lymph nodes: No abnormal radiotracer accumulation within pelvic or
abdominal nodes.

Liver: No evidence of liver metastasis

Incidental CT finding: None

Physiologic activity noted in the bowel liver and kidneys.

SKELETON

No focal  activity to suggest skeletal metastasis.
IMPRESSION: 1. Intense radiotracer activity associated with the soft tissue mass
in the LEFT pelvic floor is most consistent with local prostate
cancer recurrence.
2. No evidence of local or distant metastatic adenopathy.
3. No evidence of visceral metastasis or skeletal metastasis.

## 2022-06-15 DIAGNOSIS — H353111 Nonexudative age-related macular degeneration, right eye, early dry stage: Secondary | ICD-10-CM | POA: Diagnosis not present

## 2022-06-15 DIAGNOSIS — Z961 Presence of intraocular lens: Secondary | ICD-10-CM | POA: Diagnosis not present

## 2022-06-15 DIAGNOSIS — H401123 Primary open-angle glaucoma, left eye, severe stage: Secondary | ICD-10-CM | POA: Diagnosis not present

## 2022-06-18 DIAGNOSIS — E785 Hyperlipidemia, unspecified: Secondary | ICD-10-CM | POA: Diagnosis not present

## 2022-06-18 DIAGNOSIS — Z79899 Other long term (current) drug therapy: Secondary | ICD-10-CM | POA: Diagnosis not present

## 2022-06-18 DIAGNOSIS — R7301 Impaired fasting glucose: Secondary | ICD-10-CM | POA: Diagnosis not present

## 2022-06-21 DIAGNOSIS — Z6823 Body mass index (BMI) 23.0-23.9, adult: Secondary | ICD-10-CM | POA: Diagnosis not present

## 2022-06-21 DIAGNOSIS — I10 Essential (primary) hypertension: Secondary | ICD-10-CM | POA: Diagnosis not present

## 2022-06-21 DIAGNOSIS — I4891 Unspecified atrial fibrillation: Secondary | ICD-10-CM | POA: Diagnosis not present

## 2022-06-21 DIAGNOSIS — N1831 Chronic kidney disease, stage 3a: Secondary | ICD-10-CM | POA: Diagnosis not present

## 2022-06-21 DIAGNOSIS — D6869 Other thrombophilia: Secondary | ICD-10-CM | POA: Diagnosis not present

## 2022-06-21 DIAGNOSIS — C069 Malignant neoplasm of mouth, unspecified: Secondary | ICD-10-CM | POA: Diagnosis not present

## 2022-06-21 DIAGNOSIS — E785 Hyperlipidemia, unspecified: Secondary | ICD-10-CM | POA: Diagnosis not present

## 2022-06-21 DIAGNOSIS — Z23 Encounter for immunization: Secondary | ICD-10-CM | POA: Diagnosis not present

## 2022-09-15 DIAGNOSIS — I4891 Unspecified atrial fibrillation: Secondary | ICD-10-CM | POA: Diagnosis not present

## 2022-09-15 DIAGNOSIS — C069 Malignant neoplasm of mouth, unspecified: Secondary | ICD-10-CM | POA: Diagnosis not present

## 2022-09-15 DIAGNOSIS — K5900 Constipation, unspecified: Secondary | ICD-10-CM | POA: Diagnosis not present

## 2022-09-15 DIAGNOSIS — N1831 Chronic kidney disease, stage 3a: Secondary | ICD-10-CM | POA: Diagnosis not present

## 2022-09-15 DIAGNOSIS — D6869 Other thrombophilia: Secondary | ICD-10-CM | POA: Diagnosis not present

## 2022-09-15 DIAGNOSIS — Z6822 Body mass index (BMI) 22.0-22.9, adult: Secondary | ICD-10-CM | POA: Diagnosis not present

## 2022-11-23 DIAGNOSIS — E785 Hyperlipidemia, unspecified: Secondary | ICD-10-CM | POA: Diagnosis not present

## 2022-11-23 DIAGNOSIS — I10 Essential (primary) hypertension: Secondary | ICD-10-CM | POA: Diagnosis not present

## 2022-11-23 DIAGNOSIS — Z9181 History of falling: Secondary | ICD-10-CM | POA: Diagnosis not present

## 2022-11-23 DIAGNOSIS — Z0000 Encounter for general adult medical examination without abnormal findings: Secondary | ICD-10-CM | POA: Diagnosis not present

## 2022-11-23 DIAGNOSIS — Z6822 Body mass index (BMI) 22.0-22.9, adult: Secondary | ICD-10-CM | POA: Diagnosis not present

## 2022-11-23 DIAGNOSIS — I4891 Unspecified atrial fibrillation: Secondary | ICD-10-CM | POA: Diagnosis not present

## 2022-11-23 DIAGNOSIS — C069 Malignant neoplasm of mouth, unspecified: Secondary | ICD-10-CM | POA: Diagnosis not present

## 2022-11-23 DIAGNOSIS — D6869 Other thrombophilia: Secondary | ICD-10-CM | POA: Diagnosis not present

## 2022-11-23 DIAGNOSIS — N1831 Chronic kidney disease, stage 3a: Secondary | ICD-10-CM | POA: Diagnosis not present

## 2022-12-14 DIAGNOSIS — H401133 Primary open-angle glaucoma, bilateral, severe stage: Secondary | ICD-10-CM | POA: Diagnosis not present

## 2023-01-31 DIAGNOSIS — H401133 Primary open-angle glaucoma, bilateral, severe stage: Secondary | ICD-10-CM | POA: Diagnosis not present

## 2023-01-31 DIAGNOSIS — H353111 Nonexudative age-related macular degeneration, right eye, early dry stage: Secondary | ICD-10-CM | POA: Diagnosis not present

## 2023-01-31 DIAGNOSIS — Z961 Presence of intraocular lens: Secondary | ICD-10-CM | POA: Diagnosis not present

## 2023-04-26 DIAGNOSIS — Z6821 Body mass index (BMI) 21.0-21.9, adult: Secondary | ICD-10-CM | POA: Diagnosis not present

## 2023-04-26 DIAGNOSIS — I4891 Unspecified atrial fibrillation: Secondary | ICD-10-CM | POA: Diagnosis not present

## 2023-04-26 DIAGNOSIS — Z79899 Other long term (current) drug therapy: Secondary | ICD-10-CM | POA: Diagnosis not present

## 2023-04-26 DIAGNOSIS — E785 Hyperlipidemia, unspecified: Secondary | ICD-10-CM | POA: Diagnosis not present

## 2023-04-26 DIAGNOSIS — N1831 Chronic kidney disease, stage 3a: Secondary | ICD-10-CM | POA: Diagnosis not present

## 2023-04-26 DIAGNOSIS — R7302 Impaired glucose tolerance (oral): Secondary | ICD-10-CM | POA: Diagnosis not present

## 2023-04-26 DIAGNOSIS — D6869 Other thrombophilia: Secondary | ICD-10-CM | POA: Diagnosis not present

## 2023-04-26 DIAGNOSIS — I10 Essential (primary) hypertension: Secondary | ICD-10-CM | POA: Diagnosis not present

## 2023-04-26 DIAGNOSIS — C069 Malignant neoplasm of mouth, unspecified: Secondary | ICD-10-CM | POA: Diagnosis not present

## 2023-05-09 DIAGNOSIS — H353111 Nonexudative age-related macular degeneration, right eye, early dry stage: Secondary | ICD-10-CM | POA: Diagnosis not present

## 2023-05-09 DIAGNOSIS — H401133 Primary open-angle glaucoma, bilateral, severe stage: Secondary | ICD-10-CM | POA: Diagnosis not present

## 2023-05-09 DIAGNOSIS — Z961 Presence of intraocular lens: Secondary | ICD-10-CM | POA: Diagnosis not present

## 2023-06-01 DIAGNOSIS — H401133 Primary open-angle glaucoma, bilateral, severe stage: Secondary | ICD-10-CM | POA: Diagnosis not present

## 2023-07-12 DIAGNOSIS — H401133 Primary open-angle glaucoma, bilateral, severe stage: Secondary | ICD-10-CM | POA: Diagnosis not present

## 2023-10-18 DIAGNOSIS — H353111 Nonexudative age-related macular degeneration, right eye, early dry stage: Secondary | ICD-10-CM | POA: Diagnosis not present

## 2023-10-18 DIAGNOSIS — H401133 Primary open-angle glaucoma, bilateral, severe stage: Secondary | ICD-10-CM | POA: Diagnosis not present

## 2023-10-18 DIAGNOSIS — Z961 Presence of intraocular lens: Secondary | ICD-10-CM | POA: Diagnosis not present

## 2023-11-16 DIAGNOSIS — Z961 Presence of intraocular lens: Secondary | ICD-10-CM | POA: Diagnosis not present

## 2023-11-16 DIAGNOSIS — H353111 Nonexudative age-related macular degeneration, right eye, early dry stage: Secondary | ICD-10-CM | POA: Diagnosis not present

## 2023-11-16 DIAGNOSIS — H401133 Primary open-angle glaucoma, bilateral, severe stage: Secondary | ICD-10-CM | POA: Diagnosis not present

## 2023-11-25 DIAGNOSIS — E785 Hyperlipidemia, unspecified: Secondary | ICD-10-CM | POA: Diagnosis not present

## 2023-11-29 DIAGNOSIS — Z6821 Body mass index (BMI) 21.0-21.9, adult: Secondary | ICD-10-CM | POA: Diagnosis not present

## 2023-11-29 DIAGNOSIS — E785 Hyperlipidemia, unspecified: Secondary | ICD-10-CM | POA: Diagnosis not present

## 2023-11-29 DIAGNOSIS — K582 Mixed irritable bowel syndrome: Secondary | ICD-10-CM | POA: Diagnosis not present

## 2023-11-29 DIAGNOSIS — N1831 Chronic kidney disease, stage 3a: Secondary | ICD-10-CM | POA: Diagnosis not present

## 2023-11-29 DIAGNOSIS — D6869 Other thrombophilia: Secondary | ICD-10-CM | POA: Diagnosis not present

## 2023-11-29 DIAGNOSIS — Z0000 Encounter for general adult medical examination without abnormal findings: Secondary | ICD-10-CM | POA: Diagnosis not present

## 2023-11-29 DIAGNOSIS — C069 Malignant neoplasm of mouth, unspecified: Secondary | ICD-10-CM | POA: Diagnosis not present

## 2023-11-29 DIAGNOSIS — I4891 Unspecified atrial fibrillation: Secondary | ICD-10-CM | POA: Diagnosis not present

## 2023-11-29 DIAGNOSIS — I10 Essential (primary) hypertension: Secondary | ICD-10-CM | POA: Diagnosis not present

## 2024-01-16 DIAGNOSIS — H401133 Primary open-angle glaucoma, bilateral, severe stage: Secondary | ICD-10-CM | POA: Diagnosis not present

## 2024-02-08 DIAGNOSIS — Z6820 Body mass index (BMI) 20.0-20.9, adult: Secondary | ICD-10-CM | POA: Diagnosis not present

## 2024-02-08 DIAGNOSIS — K219 Gastro-esophageal reflux disease without esophagitis: Secondary | ICD-10-CM | POA: Diagnosis not present

## 2024-02-08 DIAGNOSIS — K582 Mixed irritable bowel syndrome: Secondary | ICD-10-CM | POA: Diagnosis not present

## 2024-03-13 ENCOUNTER — Other Ambulatory Visit (HOSPITAL_BASED_OUTPATIENT_CLINIC_OR_DEPARTMENT_OTHER): Payer: Self-pay | Admitting: Urology

## 2024-03-13 DIAGNOSIS — C61 Malignant neoplasm of prostate: Secondary | ICD-10-CM

## 2024-03-14 DIAGNOSIS — N1831 Chronic kidney disease, stage 3a: Secondary | ICD-10-CM | POA: Diagnosis not present

## 2024-03-14 DIAGNOSIS — R300 Dysuria: Secondary | ICD-10-CM | POA: Diagnosis not present

## 2024-03-14 DIAGNOSIS — I4891 Unspecified atrial fibrillation: Secondary | ICD-10-CM | POA: Diagnosis not present

## 2024-03-14 DIAGNOSIS — Z6820 Body mass index (BMI) 20.0-20.9, adult: Secondary | ICD-10-CM | POA: Diagnosis not present

## 2024-03-14 DIAGNOSIS — D6869 Other thrombophilia: Secondary | ICD-10-CM | POA: Diagnosis not present

## 2024-03-30 ENCOUNTER — Encounter (HOSPITAL_COMMUNITY)
Admission: RE | Admit: 2024-03-30 | Discharge: 2024-03-30 | Disposition: A | Discharge status: Home / Self Care | Source: Ambulatory Visit | Attending: Urology | Admitting: Urology

## 2024-03-30 DIAGNOSIS — C61 Malignant neoplasm of prostate: Secondary | ICD-10-CM | POA: Diagnosis present

## 2024-03-30 DIAGNOSIS — C7951 Secondary malignant neoplasm of bone: Secondary | ICD-10-CM | POA: Diagnosis not present

## 2024-03-30 MED ORDER — FLOTUFOLASTAT F 18 GALLIUM 296-5846 MBQ/ML IV SOLN
9.1000 | Freq: Once | INTRAVENOUS | Status: AC
  Administered 2024-03-30: 9.1 via INTRAVENOUS

## 2024-04-02 ENCOUNTER — Other Ambulatory Visit (HOSPITAL_BASED_OUTPATIENT_CLINIC_OR_DEPARTMENT_OTHER): Admitting: Radiology

## 2024-04-11 DIAGNOSIS — R194 Change in bowel habit: Secondary | ICD-10-CM | POA: Diagnosis not present

## 2024-04-26 NOTE — Addendum Note (Signed)
 Encounter addended by: de Criscio, Elley Harp M, RT on: 04/26/2024 3:33 PM  Actions taken: Imaging Exam ended

## 2024-05-13 ENCOUNTER — Other Ambulatory Visit: Payer: Self-pay | Admitting: Oncology

## 2024-05-13 DIAGNOSIS — C61 Malignant neoplasm of prostate: Secondary | ICD-10-CM

## 2024-05-13 NOTE — Progress Notes (Unsigned)
 [facility] at [facility] [address] [place],  [place]  [address] [phone]  Clinic Day:  05/14/2024  Referring physician: Rusty Lonni HERO, *   HISTORY OF PRESENT ILLNESS:  The patient is a 89 y.o. male  who I was asked to consult upon for metastatic prostate cancer.   He was initially diagnosed with prostate cancer for which he underwent a radical prostatectomy in February 2003.  Over years, his PSA progressively rose to 22.59.  This led to A PSMA PET scan being done in August 2025, which showed extensive metastasis, including bulky lymphadenopathy in his pelvic region that abutted against his left lateral bladder, leading to hydronephrosis.  Foci of metastatic bone disease, as well as retroperitoneal, hilar and mediastinal lymphadenopathy, were also seen.  As these findings were consistent with metastatic prostate cancer, the patient comes in today to discuss a palliative treatment plan.  Of note, he has already received a Firmagon injection at his urologist's office.  He also is on Casodex at this time to minimize any type of flare reaction from his androgen deprivation therapy.  Overall, the patient claims to be doing well.  Other than sporadic lower back pain, his daily quality of life, as it pertains to his metastatic prostate cancer, is decent.  PAST MEDICAL HISTORY:   Past Medical History:  Diagnosis Date   Atrial fibrillation ([facility]) 03/13/2015   Essential hypertension 03/13/2015   Prostate cancer ([facility])     PAST SURGICAL HISTORY:   Past Surgical History:  Procedure Laterality Date   BUCCAL MASS EXCISION Right 2019   DONE IN [place], [place]   HERNIA REPAIR Left    INGUINAL   PROSTATECTOMY      CURRENT MEDICATIONS:   Current Outpatient Medications  Medication Sig Dispense Refill   GEMTESA 75 MG TABS SMARTSIG:1 Tablet(s) By Mouth Every Evening     LORazepam (ATIVAN) 2 MG tablet Take 1-2 mg by mouth at bedtime as needed.     omeprazole (PRILOSEC)  40 MG capsule Take 40 mg by mouth daily.     solifenacin (VESICARE) 5 MG tablet SMARTSIG:1 Tablet(s) By Mouth Every Evening     amLODipine (NORVASC) 2.5 MG tablet Take 2.5 mg by mouth daily.     apixaban (ELIQUIS) 5 MG TABS tablet Take 5 mg by mouth 2 (two) times daily.     benazepril (LOTENSIN) 40 MG tablet Take 40 mg by mouth daily.     Cholecalciferol (VITAMIN D3) 1000 units CAPS Take 1,000 Units by mouth daily.     COMBIGAN 0.2-0.5 % ophthalmic solution instil 1 drop in right eye twice a day  6   darolutamide  (NUBEQA ) 300 MG tablet Take 1 tablet (300 mg total) by mouth 2 (two) times daily with a meal. 60 tablet 4   prednisoLONE acetate (PRED FORTE) 1 % ophthalmic suspension Administer 1 drop into the left eye daily at 0600.     vitamin E 100 UNIT capsule Take 100 Units by mouth daily.     No current facility-administered medications for this visit.    ALLERGIES:  No Known Allergies  FAMILY HISTORY:   Family History  Problem Relation Age of Onset   Heart attack Father    Hypertension Father    Dementia Sister    Diabetes Sister    Heart disease Sister    Heart attack Brother    Heart disease Brother    Diabetes Brother    Prostate cancer Brother     SOCIAL HISTORY:  The patient was born and raised in [place].  He currently lives in [place].  He is widowed; he was previously married for 60 years.  He has 2 children, 5 grandchildren, and multiple great-grandchildren.  He was a Actuary for 16 years.  He also operated a service station.  He was also a milkman in the remote past.  REVIEW OF SYSTEMS:  Review of Systems  Constitutional:  Negative for fatigue, fever and unexpected weight change.  Respiratory:  Negative for chest tightness, cough, hemoptysis and shortness of breath.   Cardiovascular:  Negative for chest pain and palpitations.  Gastrointestinal:  Negative for abdominal distention, abdominal pain, blood in stool, constipation, diarrhea, nausea and  vomiting.  Genitourinary:  Negative for dysuria, frequency and hematuria.   Musculoskeletal:  Negative for arthralgias, back pain and myalgias.  Skin:  Negative for itching and rash.  Neurological:  Negative for dizziness, headaches and light-headedness.  Psychiatric/Behavioral:  Negative for depression and suicidal ideas. The patient is not nervous/anxious.      PHYSICAL EXAM:  Blood pressure 126/82, pulse 66, temperature 97.6 F (36.4 C), temperature source Oral, resp. rate 16, height 5' 10 (1.778 m), weight 141 lb (64 kg), SpO2 100%. Wt Readings from Last 3 Encounters:  05/14/24 141 lb (64 kg)  03/24/17 172 lb 6.4 oz (78.2 kg)   Body mass index is 20.23 kg/m. Performance status (ECOG): 1 - Symptomatic but completely ambulatory Physical Exam Constitutional:      Appearance: Normal appearance. He is not ill-appearing.     Comments: A pleasant older gentleman who is wearing dark sunglasses; he is moderately thin  HENT:     Mouth/Throat:     Mouth: Mucous membranes are moist.     Pharynx: Oropharynx is clear. No oropharyngeal exudate or posterior oropharyngeal erythema.  Cardiovascular:     Rate and Rhythm: Normal rate and regular rhythm.     Heart sounds: No murmur heard.    No friction rub. No gallop.  Pulmonary:     Effort: Pulmonary effort is normal. No respiratory distress.     Breath sounds: Normal breath sounds. No wheezing, rhonchi or rales.  Abdominal:     General: Bowel sounds are normal. There is no distension.     Palpations: Abdomen is soft. There is no mass.     Tenderness: There is no abdominal tenderness.  Musculoskeletal:        General: No swelling.     Right lower leg: No edema.     Left lower leg: No edema.  Lymphadenopathy:     Cervical: No cervical adenopathy.     Upper Body:     Right upper body: No supraclavicular or axillary adenopathy.     Left upper body: No supraclavicular or axillary adenopathy.     Lower Body: No right inguinal adenopathy.  No left inguinal adenopathy.  Skin:    General: Skin is warm.     Coloration: Skin is not jaundiced.     Findings: No lesion or rash.  Neurological:     General: No focal deficit present.     Mental Status: He is alert and oriented to person, place, and time. Mental status is at baseline.  Psychiatric:        Mood and Affect: Mood normal.        Behavior: Behavior normal.        Thought Content: Thought content normal.    SCANS:  His PET scan from 04-08-24 revealed the following:  FINDINGS: NECK   Asymmetry with absent physiologic right-sided parotid and submandibular activity, likely postsurgical given the clinical history of cheek cancer. No cervical nodal tracer affinity.   Incidental CT finding: Bilateral carotid atherosclerosis. Right maxillary sinus fluid. Left maxillary sinus mucous retention cyst or polyp.   CHEST   No pulmonary parenchymal tracer avid nodes. New tracer avid thoracic nodes. Example subcarinal station at 1.7 cm and a S.U.V. max of 8.7 on 60/4. Right hilar nodal affinity at a S.U.V. max of 9.4.   Incidental CT finding: Mild cardiomegaly. Aortic and coronary artery calcification.   ABDOMEN/PELVIS   Prostate: Significant progression of the left pelvic floor recurrence. Example at 5.1 x 6.1 cm and a S.U.V. max of 13.4 on 159/4. Comparison 1.9 x 1.7 cm on the prior. Direct extension into the left a side of the urinary bladder including on 156/4.   Lymph nodes: Pelvic node dissection. New tracer avid abdominal retroperitoneal nodes. Example aortocaval node at 9 mm and a S.U.V. max of 4.3 on 112/4.   Liver: No evidence of liver metastasis.   Incidental CT finding: Normal adrenal glands. Multiple gallstones. Right hepatic lobe hypoattenuating 2.3 cm lesion on 93/4 is not tracer avid but felt to be new since the prior PET.   Development of moderate left-sided hydroureteronephrosis, followed to the level of the left pelvic mass. Prostatectomy.    SKELETON   Multifocal tracer avid osseous metastasis are new. Example within the left acetabulum at a S.U.V. max of 8.6.   Within the inferior right sacrum at 1.9 cm and a S.U.V. max of 14.1 on 149/4.   IMPRESSION: 1. Significant disease progression since 06/16/2020. Enlargement of a left pelvic local recurrence with new bladder involvement and secondary left-sided hydroureteronephrosis. 2. Nodal metastasis within the mediastinum, right hilum, and abdominal retroperitoneum. 3. Moderate volume new osseous metastasis. 4. Right hepatic lobe 2.3 cm lesion is not tracer avid but felt to be new since 06/16/2020. Considerations include metastasis from a second primary or less likely a primary such as hepatocellular carcinoma. Of questionable clinical significance given patient age and comorbidities. If further imaging evaluation is desired, pre and post contrast abdominal MRI or FDG PET could be performed. 5. Incidental findings, including: Coronary artery atherosclerosis. Aortic Atherosclerosis (ICD10-I70.0). Cholelithiasis. Sinus disease.  LABS:      Latest Ref Rng & Units 05/14/2024    1:31 PM  CBC  WBC 4.0 - 10.5 K/uL 8.1   Hemoglobin 13.0 - 17.0 g/dL 89.6   Hematocrit 60.9 - 52.0 % 31.9   Platelets 150 - 400 K/uL 310       Latest Ref Rng & Units 05/14/2024    1:31 PM  CMP  Glucose 70 - 99 mg/dL 98   BUN 8 - 23 mg/dL 35   Creatinine 9.38 - 1.24 mg/dL 7.99   Sodium 864 - 854 mmol/L 137   Potassium 3.5 - 5.1 mmol/L 5.0   Chloride 98 - 111 mmol/L 103   CO2 22 - 32 mmol/L 22   Calcium 8.9 - 10.3 mg/dL 89.9   Total Protein 6.5 - 8.1 g/dL 7.6   Total Bilirubin 0.0 - 1.2 mg/dL 0.4   Alkaline Phos 38 - 126 U/L 125   AST 15 - 41 U/L 25   ALT 0 - 44 U/L 12     Latest Reference Range & Units 05/14/24 13:30  Prostatic Specific Antigen 0.00 - 4.00 ng/mL 18.33 (H)  (H): Data is abnormally high  ASSESSMENT & PLAN:  A 89  y.o. male who I was asked to consult upon for metastatic  prostate cancer.  As mentioned previously, a PSMA PET scan recently done showed evidence of metastasis to pelvic, retroperitoneal, hilar, and mediastinal lymph nodes.  There was also evidence of osseous metastasis.  Furthermore, as mentioned previously, this gentleman has already started Firmagon therapy for androgen deprivation.  He is also taking Casodex briefly to likely offset any flare reaction from his Firmagon.  Ultimately, I do want to give this gentleman more potent complete androgen blockade therapy to further decrease his metastatic disease burden.  The antiandrogen I will place this gentleman on would be darolutamide , which be given at 300 mg twice daily.  This dose is being given based upon his reduced creatinine clearance.  He will take this in conjunction with his Firmagon to establish complete androgen blockade therapy.  Based upon his PSA level being slightly lower today than what it was at the time his PSMA PET scan was done, this likely reflects the modest benefit he has already received from his Firmagon therapy.  As he does have metastatic bone disease, I will arrange for him to receive Xgeva  injections at least once every 6 weeks to protect against future bone fractures.  His darolutamide  will be mailed to him via the [facility] outpatient pharmacy.  I will tentatively see this patient back in 6 weeks to reassess his PSA to see how well he is responding to his Firmagon/darolutamide  regimen.  The patient understands all the plans discussed today and knows to contact office before his next visit if he runs into any problems that require immediate clinical attention.  I do appreciate Street, Lonni HERO, * for his new consult.   Esmond Hinch DELENA Kerns, MD

## 2024-05-14 ENCOUNTER — Inpatient Hospital Stay: Admitting: Oncology

## 2024-05-14 ENCOUNTER — Telehealth: Payer: Self-pay

## 2024-05-14 ENCOUNTER — Other Ambulatory Visit: Payer: Self-pay | Admitting: Oncology

## 2024-05-14 ENCOUNTER — Encounter: Payer: Self-pay | Admitting: Oncology

## 2024-05-14 ENCOUNTER — Other Ambulatory Visit (HOSPITAL_COMMUNITY): Payer: Self-pay

## 2024-05-14 ENCOUNTER — Inpatient Hospital Stay: Attending: Oncology

## 2024-05-14 VITALS — BP 126/82 | HR 66 | Temp 97.6°F | Resp 16 | Ht 70.0 in | Wt 141.0 lb

## 2024-05-14 DIAGNOSIS — I2510 Atherosclerotic heart disease of native coronary artery without angina pectoris: Secondary | ICD-10-CM | POA: Insufficient documentation

## 2024-05-14 DIAGNOSIS — C7951 Secondary malignant neoplasm of bone: Secondary | ICD-10-CM | POA: Diagnosis not present

## 2024-05-14 DIAGNOSIS — C771 Secondary and unspecified malignant neoplasm of intrathoracic lymph nodes: Secondary | ICD-10-CM

## 2024-05-14 DIAGNOSIS — C61 Malignant neoplasm of prostate: Secondary | ICD-10-CM

## 2024-05-14 DIAGNOSIS — Z7901 Long term (current) use of anticoagulants: Secondary | ICD-10-CM | POA: Insufficient documentation

## 2024-05-14 DIAGNOSIS — Z9079 Acquired absence of other genital organ(s): Secondary | ICD-10-CM | POA: Insufficient documentation

## 2024-05-14 DIAGNOSIS — Z8249 Family history of ischemic heart disease and other diseases of the circulatory system: Secondary | ICD-10-CM | POA: Diagnosis not present

## 2024-05-14 DIAGNOSIS — Z79899 Other long term (current) drug therapy: Secondary | ICD-10-CM | POA: Insufficient documentation

## 2024-05-14 DIAGNOSIS — K769 Liver disease, unspecified: Secondary | ICD-10-CM | POA: Diagnosis not present

## 2024-05-14 DIAGNOSIS — Z8042 Family history of malignant neoplasm of prostate: Secondary | ICD-10-CM | POA: Insufficient documentation

## 2024-05-14 DIAGNOSIS — K8020 Calculus of gallbladder without cholecystitis without obstruction: Secondary | ICD-10-CM

## 2024-05-14 DIAGNOSIS — Z833 Family history of diabetes mellitus: Secondary | ICD-10-CM | POA: Insufficient documentation

## 2024-05-14 DIAGNOSIS — N1330 Unspecified hydronephrosis: Secondary | ICD-10-CM | POA: Diagnosis not present

## 2024-05-14 DIAGNOSIS — I700 Atherosclerosis of aorta: Secondary | ICD-10-CM

## 2024-05-14 LAB — CBC WITH DIFFERENTIAL (CANCER CENTER ONLY)
Abs Immature Granulocytes: 0.03 K/uL (ref 0.00–0.07)
Basophils Absolute: 0.1 K/uL (ref 0.0–0.1)
Basophils Relative: 1 %
Eosinophils Absolute: 0.9 K/uL — ABNORMAL HIGH (ref 0.0–0.5)
Eosinophils Relative: 11 %
HCT: 31.9 % — ABNORMAL LOW (ref 39.0–52.0)
Hemoglobin: 10.3 g/dL — ABNORMAL LOW (ref 13.0–17.0)
Immature Granulocytes: 0 %
Lymphocytes Relative: 19 %
Lymphs Abs: 1.5 K/uL (ref 0.7–4.0)
MCH: 30 pg (ref 26.0–34.0)
MCHC: 32.3 g/dL (ref 30.0–36.0)
MCV: 93 fL (ref 80.0–100.0)
Monocytes Absolute: 0.7 K/uL (ref 0.1–1.0)
Monocytes Relative: 9 %
Neutro Abs: 4.9 K/uL (ref 1.7–7.7)
Neutrophils Relative %: 60 %
Platelet Count: 310 K/uL (ref 150–400)
RBC: 3.43 MIL/uL — ABNORMAL LOW (ref 4.22–5.81)
RDW: 14.1 % (ref 11.5–15.5)
WBC Count: 8.1 K/uL (ref 4.0–10.5)
nRBC: 0 % (ref 0.0–0.2)

## 2024-05-14 LAB — CMP (CANCER CENTER ONLY)
ALT: 12 U/L (ref 0–44)
AST: 25 U/L (ref 15–41)
Albumin: 3.9 g/dL (ref 3.5–5.0)
Alkaline Phosphatase: 125 U/L (ref 38–126)
Anion gap: 12 (ref 5–15)
BUN: 35 mg/dL — ABNORMAL HIGH (ref 8–23)
CO2: 22 mmol/L (ref 22–32)
Calcium: 10 mg/dL (ref 8.9–10.3)
Chloride: 103 mmol/L (ref 98–111)
Creatinine: 2 mg/dL — ABNORMAL HIGH (ref 0.61–1.24)
GFR, Estimated: 31 mL/min — ABNORMAL LOW
Glucose, Bld: 98 mg/dL (ref 70–99)
Potassium: 5 mmol/L (ref 3.5–5.1)
Sodium: 137 mmol/L (ref 135–145)
Total Bilirubin: 0.4 mg/dL (ref 0.0–1.2)
Total Protein: 7.6 g/dL (ref 6.5–8.1)

## 2024-05-14 LAB — PSA: Prostatic Specific Antigen: 18.33 ng/mL — ABNORMAL HIGH (ref 0.00–4.00)

## 2024-05-14 MED ORDER — DAROLUTAMIDE 300 MG PO TABS
300.0000 mg | ORAL_TABLET | Freq: Two times a day (BID) | ORAL | 4 refills | Status: DC
  Filled 2024-05-16: qty 60, 30d supply, fill #0
  Filled 2024-06-07: qty 60, 30d supply, fill #1
  Filled 2024-07-05 – 2024-07-09 (×2): qty 60, 30d supply, fill #2
  Filled 2024-08-02: qty 60, 30d supply, fill #3

## 2024-05-14 NOTE — Telephone Encounter (Signed)
 Oral Oncology Patient Advocate Encounter   Received notification that prior authorization for darolutamide  (NUBEQA ) 300 MG tablet  is required.   PA submitted on 05/14/24 [place] [id number] Status is pending     Lucie Lamer, CPhT [place]  [facility] Pharmacy Technician Patient Advocate Specialist II THER[place]SA [place] Phone: [phone]  Fax: [phone] [email or web address]

## 2024-05-15 ENCOUNTER — Telehealth: Payer: Self-pay

## 2024-05-15 ENCOUNTER — Encounter: Payer: Self-pay | Admitting: Oncology

## 2024-05-15 ENCOUNTER — Other Ambulatory Visit (HOSPITAL_COMMUNITY): Payer: Self-pay

## 2024-05-15 DIAGNOSIS — H401133 Primary open-angle glaucoma, bilateral, severe stage: Secondary | ICD-10-CM | POA: Diagnosis not present

## 2024-05-15 DIAGNOSIS — H34811 Central retinal vein occlusion, right eye, with macular edema: Secondary | ICD-10-CM | POA: Diagnosis not present

## 2024-05-15 DIAGNOSIS — H353111 Nonexudative age-related macular degeneration, right eye, early dry stage: Secondary | ICD-10-CM | POA: Diagnosis not present

## 2024-05-15 DIAGNOSIS — Z961 Presence of intraocular lens: Secondary | ICD-10-CM | POA: Diagnosis not present

## 2024-05-15 NOTE — Telephone Encounter (Signed)
 Oral Oncology Pharmacist Encounter  Received new prescription for Nubeqa  (darolutamide ) for the treatment of metastatic, castration sensitive prostate cancer in conjunction with Firmagon (administered at urology office), planned duration until disease progression or unacceptable toxicity.  Labs from 05/14/2024 assessed, patients creatinine clearance based on a Scr of 2.0 is 21.33. Prescription already dose reduced by MD. Prescription dose and frequency assessed for appropriateness.   Current medication list in Epic reviewed, no significant/ relevant DDIs with Nubeqa  identified.  Evaluated chart and no patient barriers to medication adherence noted.   Patient agreement for treatment documented in MD note on 05/14/2024.  Prescription has been e-scribed to the [facility] for benefits analysis and approval.  Oral Oncology Clinic will continue to follow for insurance authorization, copayment issues, initial counseling and start date.  Sharion Grieves, PharmD Hematology/Oncology Clinical Pharmacist [facility] Oral Chemotherapy Navigation Clinic [phone] 05/15/2024 8:28 AM

## 2024-05-15 NOTE — Telephone Encounter (Signed)
 Oral Oncology Patient Advocate Encounter  Prior Authorization for Nubeqa  has been approved.    PA# [id number]  Effective dates: 05/15/24 through 08/29/24  Patients co-pay is $0.00.    Lucie Lamer, CPhT [place]  [facility] Pharmacy Technician Patient Advocate Specialist II THERESSA [place] Phone: [phone]  Fax: [phone] [email or web address]

## 2024-05-16 ENCOUNTER — Inpatient Hospital Stay

## 2024-05-16 ENCOUNTER — Other Ambulatory Visit: Payer: Self-pay

## 2024-05-16 ENCOUNTER — Other Ambulatory Visit (HOSPITAL_COMMUNITY): Payer: Self-pay

## 2024-05-16 ENCOUNTER — Encounter: Payer: Self-pay | Admitting: Oncology

## 2024-05-16 VITALS — BP 138/68 | HR 58 | Temp 98.0°F

## 2024-05-16 DIAGNOSIS — Z8249 Family history of ischemic heart disease and other diseases of the circulatory system: Secondary | ICD-10-CM | POA: Diagnosis not present

## 2024-05-16 DIAGNOSIS — Z79899 Other long term (current) drug therapy: Secondary | ICD-10-CM | POA: Diagnosis not present

## 2024-05-16 DIAGNOSIS — I2510 Atherosclerotic heart disease of native coronary artery without angina pectoris: Secondary | ICD-10-CM | POA: Diagnosis not present

## 2024-05-16 DIAGNOSIS — C771 Secondary and unspecified malignant neoplasm of intrathoracic lymph nodes: Secondary | ICD-10-CM | POA: Diagnosis not present

## 2024-05-16 DIAGNOSIS — K8020 Calculus of gallbladder without cholecystitis without obstruction: Secondary | ICD-10-CM | POA: Diagnosis not present

## 2024-05-16 DIAGNOSIS — K769 Liver disease, unspecified: Secondary | ICD-10-CM | POA: Diagnosis not present

## 2024-05-16 DIAGNOSIS — Z7901 Long term (current) use of anticoagulants: Secondary | ICD-10-CM | POA: Diagnosis not present

## 2024-05-16 DIAGNOSIS — C7951 Secondary malignant neoplasm of bone: Secondary | ICD-10-CM

## 2024-05-16 DIAGNOSIS — N1330 Unspecified hydronephrosis: Secondary | ICD-10-CM | POA: Diagnosis not present

## 2024-05-16 DIAGNOSIS — I700 Atherosclerosis of aorta: Secondary | ICD-10-CM | POA: Diagnosis not present

## 2024-05-16 DIAGNOSIS — Z833 Family history of diabetes mellitus: Secondary | ICD-10-CM | POA: Diagnosis not present

## 2024-05-16 DIAGNOSIS — C61 Malignant neoplasm of prostate: Secondary | ICD-10-CM | POA: Diagnosis not present

## 2024-05-16 MED ORDER — DENOSUMAB 120 MG/1.7ML ~~LOC~~ SOLN
120.0000 mg | Freq: Once | SUBCUTANEOUS | Status: AC
  Administered 2024-05-16: 120 mg via SUBCUTANEOUS
  Filled 2024-05-16: qty 1.7

## 2024-05-16 NOTE — Patient Instructions (Signed)
 Calcium Capsules or Tablets What is this medication? CALCIUM (KAL see um) increases calcium levels in your body. Calcium is a mineral that plays an important role in building strong bones and maintaining heart health. This medicine may be used for other purposes; ask your health care provider or pharmacist if you have questions. COMMON BRAND NAME(S): AcidFree, Calcarb 600, Calcitrate, Calphron, Caltrate, Nephro-Calci, Oysco 500 What should I tell my care team before I take this medication? They need to know if you have any of these conditions: An unusual or allergic reaction to calcium, other medications, foods, dyes, or preservatives Pregnant or trying to get pregnant Breastfeeding How should I use this medication? Take this supplement as directed on the label. Do not take it more often than recommended. Talk to your care team if you take other medications or supplements. Talk to your care team before giving supplements to a child. Special care may be needed. Overdosage: If you think you have taken too much of this medicine contact a poison control center or emergency room at once. NOTE: This medicine is only for you. Do not share this medicine with others. What if I miss a dose? Do not use this supplement more often than directed. What may interact with this medication? Bisphosphonates, such as alendronate Certain antibiotics, such as ciprofloxacin, doxycycline, levofloxacin, minocycline Thyroid  hormones, such as levothyroxine This list may not describe all possible interactions. Give your health care provider a list of all the medicines, herbs, non-prescription drugs, or dietary supplements you use. Also tell them if you smoke, drink alcohol, or use illegal drugs. Some items may interact with your medicine. What should I watch for while using this medication? See your care team for regular checks on your progress. What side effects may I notice from receiving this medication? Side effects  that you should report to your care team as soon as possible: Allergic reactions--skin rash, itching, hives, swelling of the face, lips, tongue, or throat High calcium level--increased thirst or amount of urine, nausea, vomiting, confusion, unusual weakness or fatigue, bone pain Side effects that usually do not require medical attention (report these to your care team if they continue or are bothersome): Burping Constipation Gas This list may not describe all possible side effects. Call your doctor for medical advice about side effects. You may report side effects to FDA at 1-800-FDA-1088. Where should I keep my medication? Keep out of the reach of children and pets. Most supplements should be stored at room temperature. Check your specific product directions. Protect from heat and moisture. Get rid of any unused supplement after the expiration date. NOTE: This sheet is a summary. It may not cover all possible information. If you have questions about this medicine, talk to your doctor, pharmacist, or health care provider.  2024 Elsevier/Gold Standard (2022-02-25 00:00:00)Denosumab  Injection (Oncology) What is this medication? DENOSUMAB  (den oh SUE mab) prevents weakened bones caused by cancer. It may also be used to treat noncancerous bone tumors that cannot be removed by surgery. It can also be used to treat high calcium levels in the blood caused by cancer. It works by blocking a protein that causes bones to break down quickly. This slows down the release of calcium from bones, which lowers calcium levels in your blood. It also makes your bones stronger and less likely to break (fracture). This medicine may be used for other purposes; ask your health care provider or pharmacist if you have questions. COMMON BRAND NAME(S): XGEVA  What should I tell my  care team before I take this medication? They need to know if you have any of these conditions: Dental disease Having surgery or tooth  extraction Infection Kidney disease Low levels of calcium or vitamin D in the blood Malnutrition On hemodialysis Skin conditions or sensitivity Thyroid  or parathyroid disease An unusual reaction to denosumab , other medications, foods, dyes, or preservatives Pregnant or trying to get pregnant Breast-feeding How should I use this medication? This medication is for injection under the skin. It is given by your care team in a hospital or clinic setting. A special MedGuide will be given to you before each treatment. Be sure to read this information carefully each time. Talk to your care team about the use of this medication in children. While it may be prescribed for children as young as 13 years for selected conditions, precautions do apply. Overdosage: If you think you have taken too much of this medicine contact a poison control center or emergency room at once. NOTE: This medicine is only for you. Do not share this medicine with others. What if I miss a dose? Keep appointments for follow-up doses. It is important not to miss your dose. Call your care team if you are unable to keep an appointment. What may interact with this medication? Do not take this medication with any of the following: Other medications containing denosumab  This medication may also interact with the following: Medications that lower your chance of fighting infection Steroid medications, such as prednisone or cortisone This list may not describe all possible interactions. Give your health care provider a list of all the medicines, herbs, non-prescription drugs, or dietary supplements you use. Also tell them if you smoke, drink alcohol, or use illegal drugs. Some items may interact with your medicine. What should I watch for while using this medication? Your condition will be monitored carefully while you are receiving this medication. You may need blood work while taking this medication. This medication may increase  your risk of getting an infection. Call your care team for advice if you get a fever, chills, sore throat, or other symptoms of a cold or flu. Do not treat yourself. Try to avoid being around people who are sick. You should make sure you get enough calcium and vitamin D while you are taking this medication, unless your care team tells you not to. Discuss the foods you eat and the vitamins you take with your care team. Some people who take this medication have severe bone, joint, or muscle pain. This medication may also increase your risk for jaw problems or a broken thigh bone. Tell your care team right away if you have severe pain in your jaw, bones, joints, or muscles. Tell your care team if you have any pain that does not go away or that gets worse. Talk to your care team if you may be pregnant. Serious birth defects can occur if you take this medication during pregnancy and for 5 months after the last dose. You will need a negative pregnancy test before starting this medication. Contraception is recommended while taking this medication and for 5 months after the last dose. Your care team can help you find the option that works for you. What side effects may I notice from receiving this medication? Side effects that you should report to your care team as soon as possible: Allergic reactions--skin rash, itching, hives, swelling of the face, lips, tongue, or throat Bone, joint, or muscle pain Low calcium level--muscle pain or cramps, confusion, tingling,  or numbness in the hands or feet Osteonecrosis of the jaw--pain, swelling, or redness in the mouth, numbness of the jaw, poor healing after dental work, unusual discharge from the mouth, visible bones in the mouth Side effects that usually do not require medical attention (report to your care team if they continue or are bothersome): Cough Diarrhea Fatigue Headache Nausea This list may not describe all possible side effects. Call your doctor for  medical advice about side effects. You may report side effects to FDA at 1-800-FDA-1088. Where should I keep my medication? This medication is given in a hospital or clinic. It will not be stored at home. NOTE: This sheet is a summary. It may not cover all possible information. If you have questions about this medicine, talk to your doctor, pharmacist, or health care provider.  2024 Elsevier/Gold Standard (2022-01-06 00:00:00)

## 2024-05-16 NOTE — Progress Notes (Signed)
 Oral Chemotherapy Pharmacist Encounter   I spoke with patient in clinic for overview of new oral chemotherapy medication: Nubeqa  (darolutamide ) for the treatment of prostate cancer in conjunction with Xgeva  and Firmagon, planned duration for Nubeqa  until disease progression or unacceptable drug toxicity. Treatment goal: Palliative   Counseled patient on administration, dosing, side effects, monitoring, drug-food interactions, safe handling, storage, and disposal.   Patient will take Nubeqa  300 mg tablet, 1 tablet (300 mg total) by mouth 2 (two) times daily with a meal. Patient knows to avoid grapefruit and grapefruit juice while on Nubeqa .   Start date: 05/18/2024   Side effects include but not limited to: decreased white blood count, changes in LFTs, hypertension. Patient notified of small risk of seizures and is aware to call the office if any changes occur. Patient aware to notify the office of a fever >100.4oF.    Reviewed with patient importance of keeping a medication schedule and plan for any missed doses. Notified patient to not add this medication to his daily pill box at this time due to the medication recommendations and to notify us  if he is having any difficulty remembering to take the medication.   Communication and Learning Assessment Primary learner: Patient and his son's wife Barriers to learning: No barriers Preferred language: English Learning preferences: Listening Reading   Patient voiced understanding and appreciation. All questions answered. Medication handout provided. After discussion with patient no patient barriers to medication adherence identified. Distress thermometer completed during in person visit and reviewed with patient. Due to score, social work referral has not been sent. Provided patient with Oral Chemotherapy Navigation Clinic phone number. Patient knows to call the office with questions or concerns.Patient received phone number of the specialty pharmacy for  any refill concerns.   Jayke Caul, PharmD Hematology/Oncology Clinical Pharmacist [facility] Oral Chemotherapy Navigation Clinic [phone] 05/16/2024 10:52 AM

## 2024-05-16 NOTE — Progress Notes (Signed)
 Patient counseled on Nubeqa  in clinic visit on 05/16/2024.  Thoams Siefert, PharmD Hematology/Oncology Clinical Pharmacist [facility] [phone]

## 2024-05-16 NOTE — Progress Notes (Signed)
 Specialty Pharmacy Initial Fill Coordination Note  Justin Hull is a 89 y.o. male contacted today regarding refills of specialty medication(s) Darolutamide  (NUBEQA ) .  Patient requested Delivery  on 05/17/24  to verified address [address]   [place] [address]   Medication will be filled on 05/16/24.   Patient is aware of $0.00 copayment.   Lucie Lamer, CPhT [place]  [facility] Specialty Pharmacy Services Pharmacy Technician Patient Advocate Specialist [facility] [place] Phone: [phone]  Fax: [phone] [email or web address]

## 2024-05-17 ENCOUNTER — Inpatient Hospital Stay

## 2024-05-17 NOTE — Progress Notes (Signed)
 [facility] Clinical Social Work  Initial Assessment   Justin Hull is a 89 y.o. year old male contacted caregiver by phone (son, Chyrl). Clinical Social Work was referred by nurse for assessment of psychosocial needs.   SDOH (Social Determinants of Health) assessments performed: Yes   SDOH Screenings   Food Insecurity: No Food Insecurity (05/14/2024)  Housing: Unknown (05/14/2024)  Transportation Needs: No Transportation Needs (05/14/2024)  Utilities: Not At Risk (05/14/2024)  Depression (PHQ2-9): Low Risk  (05/14/2024)  Tobacco Use: Medium Risk (05/14/2024)    PHQ 2/9:    05/14/2024    3:01 PM 05/14/2024    2:00 PM  Depression screen PHQ 2/9  Decreased Interest 0 0  Down, Depressed, Hopeless 0 0  PHQ - 2 Score 0 0     Distress Screen completed: No    05/16/2024   11:00 AM  ONCBCN DISTRESS SCREENING  Screening Type Initial Screening  How much distress have you been experiencing in the past week? (0-10) 0      Family/Social Information:  Housing Arrangement: patient lives alone. However, Patient's sons are working on moving him in with them. Family members/support persons in your life? Patient's son Chyrl / Spouse and his son Medford are the members of his support system. Transportation concerns: No reported transportation barriers. Patient is transported to his appointments by his son.   Employment: Retired .  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 Patient attended oral chemo clinic with his daughter in law. Son and Spouse will be assisting with ensuring patient understands the medication. Oral Pharmacist will touch base with son as requested.  Concerns about diagnosis and/or treatment: No concerns about treatment. However, changes in his physical  ability has led to increased sadness, as he is unable to do the activities he enjoys.  Patient reported stressors: Physical issues Patient enjoys Prior to the changes in his physical abilities, patient enjoyed doing yard work and being active.  Current coping skills/ strengths: Supportive family/friends     SUMMARY: Current SDOH Barriers:  No SDOH barriers identified.   Clinical Social Work Clinical Goal(s):  No Visual merchandiser goals identified. No SDOH concerns, No distress concerns.   Interventions: Discussed common feeling and emotions when being diagnosed with cancer, and the importance of support during treatment Informed patient of the support team roles and support services at [facility] Provided CSW contact information and encouraged patient to call with any questions or concerns   Follow Up Plan: Patient will contact CSW with any support or resource needs Patient verbalizes understanding of plan: Yes    Lizbeth Sprague, LCSW Clinical Social Worker [facility]

## 2024-05-18 ENCOUNTER — Encounter (INDEPENDENT_AMBULATORY_CARE_PROVIDER_SITE_OTHER): Admitting: Ophthalmology

## 2024-05-18 ENCOUNTER — Telehealth: Payer: Self-pay

## 2024-05-18 DIAGNOSIS — H34811 Central retinal vein occlusion, right eye, with macular edema: Secondary | ICD-10-CM

## 2024-05-18 DIAGNOSIS — H35031 Hypertensive retinopathy, right eye: Secondary | ICD-10-CM | POA: Diagnosis not present

## 2024-05-18 DIAGNOSIS — I10 Essential (primary) hypertension: Secondary | ICD-10-CM | POA: Diagnosis not present

## 2024-05-18 DIAGNOSIS — H43811 Vitreous degeneration, right eye: Secondary | ICD-10-CM | POA: Diagnosis not present

## 2024-05-18 NOTE — Telephone Encounter (Signed)
 Oncology Pharmacist Encounter  Received notification from social work that the patients sons had requested the information presented to him at his appointment to be also given to them. I called patients son and went over information in regards to Nubeqa . Both sons were available and it was discussed with both.   Anothony Bursch, PharmD Hematology/Oncology Clinical Pharmacist [facility] [phone]

## 2024-05-22 DIAGNOSIS — Z961 Presence of intraocular lens: Secondary | ICD-10-CM | POA: Diagnosis not present

## 2024-05-22 DIAGNOSIS — H401133 Primary open-angle glaucoma, bilateral, severe stage: Secondary | ICD-10-CM | POA: Diagnosis not present

## 2024-05-23 ENCOUNTER — Telehealth: Payer: Self-pay

## 2024-05-23 ENCOUNTER — Other Ambulatory Visit: Payer: Self-pay | Admitting: Hematology and Oncology

## 2024-05-23 NOTE — Telephone Encounter (Signed)
 Called [facility] spoke with Gustav [phone].  They need a prescription in order to give the medication darolutamide .  Eleanor Bach, NP printed existing prescription and signed it.   I left a message for Gustav to pick up order.

## 2024-05-25 ENCOUNTER — Telehealth: Payer: Self-pay

## 2024-05-25 NOTE — Telephone Encounter (Signed)
 Pt is taking Nubeqa  300mg  po BID. He takes am dose between 830a-9a after breakfast and again after dinner, which is 7-730p. No missed doses. Chyrl is moving his dad into [employer] on Monday, 05/28/2024. He will be staying with Chyrl this weekend @ Jeff's home. Chyrl states, He has tolerated the Nubeqa  well.

## 2024-05-30 DIAGNOSIS — H5462 Unqualified visual loss, left eye, normal vision right eye: Secondary | ICD-10-CM | POA: Diagnosis not present

## 2024-05-30 DIAGNOSIS — Z7901 Long term (current) use of anticoagulants: Secondary | ICD-10-CM | POA: Diagnosis not present

## 2024-05-30 DIAGNOSIS — K5900 Constipation, unspecified: Secondary | ICD-10-CM | POA: Diagnosis not present

## 2024-05-30 DIAGNOSIS — G4700 Insomnia, unspecified: Secondary | ICD-10-CM | POA: Diagnosis not present

## 2024-05-30 DIAGNOSIS — Z87891 Personal history of nicotine dependence: Secondary | ICD-10-CM | POA: Diagnosis not present

## 2024-05-30 DIAGNOSIS — H409 Unspecified glaucoma: Secondary | ICD-10-CM | POA: Diagnosis not present

## 2024-05-30 DIAGNOSIS — N1330 Unspecified hydronephrosis: Secondary | ICD-10-CM | POA: Diagnosis not present

## 2024-05-30 DIAGNOSIS — C772 Secondary and unspecified malignant neoplasm of intra-abdominal lymph nodes: Secondary | ICD-10-CM | POA: Diagnosis not present

## 2024-05-30 DIAGNOSIS — I129 Hypertensive chronic kidney disease with stage 1 through stage 4 chronic kidney disease, or unspecified chronic kidney disease: Secondary | ICD-10-CM | POA: Diagnosis not present

## 2024-05-30 DIAGNOSIS — D6859 Other primary thrombophilia: Secondary | ICD-10-CM | POA: Diagnosis not present

## 2024-05-30 DIAGNOSIS — K219 Gastro-esophageal reflux disease without esophagitis: Secondary | ICD-10-CM | POA: Diagnosis not present

## 2024-05-30 DIAGNOSIS — N189 Chronic kidney disease, unspecified: Secondary | ICD-10-CM | POA: Diagnosis not present

## 2024-05-30 DIAGNOSIS — C7951 Secondary malignant neoplasm of bone: Secondary | ICD-10-CM | POA: Diagnosis not present

## 2024-05-30 DIAGNOSIS — E785 Hyperlipidemia, unspecified: Secondary | ICD-10-CM | POA: Diagnosis not present

## 2024-05-30 DIAGNOSIS — M1990 Unspecified osteoarthritis, unspecified site: Secondary | ICD-10-CM | POA: Diagnosis not present

## 2024-05-30 DIAGNOSIS — E559 Vitamin D deficiency, unspecified: Secondary | ICD-10-CM | POA: Diagnosis not present

## 2024-05-30 DIAGNOSIS — I4891 Unspecified atrial fibrillation: Secondary | ICD-10-CM | POA: Diagnosis not present

## 2024-05-31 DIAGNOSIS — H5440 Blindness, one eye, unspecified eye: Secondary | ICD-10-CM | POA: Diagnosis not present

## 2024-05-31 DIAGNOSIS — K219 Gastro-esophageal reflux disease without esophagitis: Secondary | ICD-10-CM | POA: Diagnosis not present

## 2024-05-31 DIAGNOSIS — H5461 Unqualified visual loss, right eye, normal vision left eye: Secondary | ICD-10-CM | POA: Diagnosis not present

## 2024-05-31 DIAGNOSIS — I4891 Unspecified atrial fibrillation: Secondary | ICD-10-CM | POA: Diagnosis not present

## 2024-05-31 DIAGNOSIS — K5900 Constipation, unspecified: Secondary | ICD-10-CM | POA: Diagnosis not present

## 2024-05-31 DIAGNOSIS — G4700 Insomnia, unspecified: Secondary | ICD-10-CM | POA: Diagnosis not present

## 2024-05-31 DIAGNOSIS — N189 Chronic kidney disease, unspecified: Secondary | ICD-10-CM | POA: Diagnosis not present

## 2024-05-31 DIAGNOSIS — E785 Hyperlipidemia, unspecified: Secondary | ICD-10-CM | POA: Diagnosis not present

## 2024-05-31 DIAGNOSIS — I10 Essential (primary) hypertension: Secondary | ICD-10-CM | POA: Diagnosis not present

## 2024-05-31 DIAGNOSIS — H409 Unspecified glaucoma: Secondary | ICD-10-CM | POA: Diagnosis not present

## 2024-05-31 DIAGNOSIS — D6859 Other primary thrombophilia: Secondary | ICD-10-CM | POA: Diagnosis not present

## 2024-06-01 DIAGNOSIS — H409 Unspecified glaucoma: Secondary | ICD-10-CM | POA: Diagnosis not present

## 2024-06-01 DIAGNOSIS — I4891 Unspecified atrial fibrillation: Secondary | ICD-10-CM | POA: Diagnosis not present

## 2024-06-01 DIAGNOSIS — G4700 Insomnia, unspecified: Secondary | ICD-10-CM | POA: Diagnosis not present

## 2024-06-01 DIAGNOSIS — C772 Secondary and unspecified malignant neoplasm of intra-abdominal lymph nodes: Secondary | ICD-10-CM | POA: Diagnosis not present

## 2024-06-01 DIAGNOSIS — D6859 Other primary thrombophilia: Secondary | ICD-10-CM | POA: Diagnosis not present

## 2024-06-01 DIAGNOSIS — N189 Chronic kidney disease, unspecified: Secondary | ICD-10-CM | POA: Diagnosis not present

## 2024-06-01 DIAGNOSIS — H5462 Unqualified visual loss, left eye, normal vision right eye: Secondary | ICD-10-CM | POA: Diagnosis not present

## 2024-06-01 DIAGNOSIS — I129 Hypertensive chronic kidney disease with stage 1 through stage 4 chronic kidney disease, or unspecified chronic kidney disease: Secondary | ICD-10-CM | POA: Diagnosis not present

## 2024-06-01 DIAGNOSIS — Z87891 Personal history of nicotine dependence: Secondary | ICD-10-CM | POA: Diagnosis not present

## 2024-06-01 DIAGNOSIS — K5900 Constipation, unspecified: Secondary | ICD-10-CM | POA: Diagnosis not present

## 2024-06-01 DIAGNOSIS — K219 Gastro-esophageal reflux disease without esophagitis: Secondary | ICD-10-CM | POA: Diagnosis not present

## 2024-06-01 DIAGNOSIS — M1990 Unspecified osteoarthritis, unspecified site: Secondary | ICD-10-CM | POA: Diagnosis not present

## 2024-06-01 DIAGNOSIS — C7951 Secondary malignant neoplasm of bone: Secondary | ICD-10-CM | POA: Diagnosis not present

## 2024-06-01 DIAGNOSIS — E785 Hyperlipidemia, unspecified: Secondary | ICD-10-CM | POA: Diagnosis not present

## 2024-06-01 DIAGNOSIS — N1330 Unspecified hydronephrosis: Secondary | ICD-10-CM | POA: Diagnosis not present

## 2024-06-01 DIAGNOSIS — Z7901 Long term (current) use of anticoagulants: Secondary | ICD-10-CM | POA: Diagnosis not present

## 2024-06-01 DIAGNOSIS — E559 Vitamin D deficiency, unspecified: Secondary | ICD-10-CM | POA: Diagnosis not present

## 2024-06-04 DIAGNOSIS — Z961 Presence of intraocular lens: Secondary | ICD-10-CM | POA: Diagnosis not present

## 2024-06-04 DIAGNOSIS — H4089 Other specified glaucoma: Secondary | ICD-10-CM | POA: Diagnosis not present

## 2024-06-04 DIAGNOSIS — H40841 Neovascular secondary angle closure glaucoma, right eye: Secondary | ICD-10-CM | POA: Diagnosis not present

## 2024-06-04 DIAGNOSIS — H401133 Primary open-angle glaucoma, bilateral, severe stage: Secondary | ICD-10-CM | POA: Diagnosis not present

## 2024-06-04 DIAGNOSIS — H348111 Central retinal vein occlusion, right eye, with retinal neovascularization: Secondary | ICD-10-CM | POA: Diagnosis not present

## 2024-06-04 DIAGNOSIS — Z79899 Other long term (current) drug therapy: Secondary | ICD-10-CM | POA: Diagnosis not present

## 2024-06-04 NOTE — Progress Notes (Signed)
 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

## 2024-06-05 ENCOUNTER — Other Ambulatory Visit: Payer: Self-pay

## 2024-06-05 DIAGNOSIS — E038 Other specified hypothyroidism: Secondary | ICD-10-CM | POA: Diagnosis not present

## 2024-06-05 DIAGNOSIS — E782 Mixed hyperlipidemia: Secondary | ICD-10-CM | POA: Diagnosis not present

## 2024-06-05 DIAGNOSIS — E559 Vitamin D deficiency, unspecified: Secondary | ICD-10-CM | POA: Diagnosis not present

## 2024-06-05 DIAGNOSIS — D519 Vitamin B12 deficiency anemia, unspecified: Secondary | ICD-10-CM | POA: Diagnosis not present

## 2024-06-05 DIAGNOSIS — R7309 Other abnormal glucose: Secondary | ICD-10-CM | POA: Diagnosis not present

## 2024-06-05 DIAGNOSIS — Z79899 Other long term (current) drug therapy: Secondary | ICD-10-CM | POA: Diagnosis not present

## 2024-06-06 ENCOUNTER — Other Ambulatory Visit: Payer: Self-pay

## 2024-06-06 DIAGNOSIS — K5900 Constipation, unspecified: Secondary | ICD-10-CM | POA: Diagnosis not present

## 2024-06-06 DIAGNOSIS — Z87891 Personal history of nicotine dependence: Secondary | ICD-10-CM | POA: Diagnosis not present

## 2024-06-06 DIAGNOSIS — N1330 Unspecified hydronephrosis: Secondary | ICD-10-CM | POA: Diagnosis not present

## 2024-06-06 DIAGNOSIS — I4891 Unspecified atrial fibrillation: Secondary | ICD-10-CM | POA: Diagnosis not present

## 2024-06-06 DIAGNOSIS — M1990 Unspecified osteoarthritis, unspecified site: Secondary | ICD-10-CM | POA: Diagnosis not present

## 2024-06-06 DIAGNOSIS — C7951 Secondary malignant neoplasm of bone: Secondary | ICD-10-CM | POA: Diagnosis not present

## 2024-06-06 DIAGNOSIS — E559 Vitamin D deficiency, unspecified: Secondary | ICD-10-CM | POA: Diagnosis not present

## 2024-06-06 DIAGNOSIS — I129 Hypertensive chronic kidney disease with stage 1 through stage 4 chronic kidney disease, or unspecified chronic kidney disease: Secondary | ICD-10-CM | POA: Diagnosis not present

## 2024-06-06 DIAGNOSIS — H5462 Unqualified visual loss, left eye, normal vision right eye: Secondary | ICD-10-CM | POA: Diagnosis not present

## 2024-06-06 DIAGNOSIS — H409 Unspecified glaucoma: Secondary | ICD-10-CM | POA: Diagnosis not present

## 2024-06-06 DIAGNOSIS — K219 Gastro-esophageal reflux disease without esophagitis: Secondary | ICD-10-CM | POA: Diagnosis not present

## 2024-06-06 DIAGNOSIS — Z7901 Long term (current) use of anticoagulants: Secondary | ICD-10-CM | POA: Diagnosis not present

## 2024-06-06 DIAGNOSIS — E785 Hyperlipidemia, unspecified: Secondary | ICD-10-CM | POA: Diagnosis not present

## 2024-06-06 DIAGNOSIS — G4700 Insomnia, unspecified: Secondary | ICD-10-CM | POA: Diagnosis not present

## 2024-06-06 DIAGNOSIS — D6859 Other primary thrombophilia: Secondary | ICD-10-CM | POA: Diagnosis not present

## 2024-06-06 DIAGNOSIS — N189 Chronic kidney disease, unspecified: Secondary | ICD-10-CM | POA: Diagnosis not present

## 2024-06-06 DIAGNOSIS — C772 Secondary and unspecified malignant neoplasm of intra-abdominal lymph nodes: Secondary | ICD-10-CM | POA: Diagnosis not present

## 2024-06-07 ENCOUNTER — Other Ambulatory Visit: Payer: Self-pay

## 2024-06-07 DIAGNOSIS — K5900 Constipation, unspecified: Secondary | ICD-10-CM | POA: Diagnosis not present

## 2024-06-07 DIAGNOSIS — H409 Unspecified glaucoma: Secondary | ICD-10-CM | POA: Diagnosis not present

## 2024-06-07 DIAGNOSIS — Z7901 Long term (current) use of anticoagulants: Secondary | ICD-10-CM | POA: Diagnosis not present

## 2024-06-07 DIAGNOSIS — I129 Hypertensive chronic kidney disease with stage 1 through stage 4 chronic kidney disease, or unspecified chronic kidney disease: Secondary | ICD-10-CM | POA: Diagnosis not present

## 2024-06-07 DIAGNOSIS — C7951 Secondary malignant neoplasm of bone: Secondary | ICD-10-CM | POA: Diagnosis not present

## 2024-06-07 DIAGNOSIS — N189 Chronic kidney disease, unspecified: Secondary | ICD-10-CM | POA: Diagnosis not present

## 2024-06-07 DIAGNOSIS — E559 Vitamin D deficiency, unspecified: Secondary | ICD-10-CM | POA: Diagnosis not present

## 2024-06-07 DIAGNOSIS — M1990 Unspecified osteoarthritis, unspecified site: Secondary | ICD-10-CM | POA: Diagnosis not present

## 2024-06-07 DIAGNOSIS — Z87891 Personal history of nicotine dependence: Secondary | ICD-10-CM | POA: Diagnosis not present

## 2024-06-07 DIAGNOSIS — H5462 Unqualified visual loss, left eye, normal vision right eye: Secondary | ICD-10-CM | POA: Diagnosis not present

## 2024-06-07 DIAGNOSIS — N1330 Unspecified hydronephrosis: Secondary | ICD-10-CM | POA: Diagnosis not present

## 2024-06-07 DIAGNOSIS — K219 Gastro-esophageal reflux disease without esophagitis: Secondary | ICD-10-CM | POA: Diagnosis not present

## 2024-06-07 DIAGNOSIS — G4700 Insomnia, unspecified: Secondary | ICD-10-CM | POA: Diagnosis not present

## 2024-06-07 DIAGNOSIS — D6859 Other primary thrombophilia: Secondary | ICD-10-CM | POA: Diagnosis not present

## 2024-06-07 DIAGNOSIS — I4891 Unspecified atrial fibrillation: Secondary | ICD-10-CM | POA: Diagnosis not present

## 2024-06-07 DIAGNOSIS — E785 Hyperlipidemia, unspecified: Secondary | ICD-10-CM | POA: Diagnosis not present

## 2024-06-07 DIAGNOSIS — C772 Secondary and unspecified malignant neoplasm of intra-abdominal lymph nodes: Secondary | ICD-10-CM | POA: Diagnosis not present

## 2024-06-07 NOTE — Progress Notes (Signed)
 Specialty Pharmacy Refill Coordination Note  I spoke with the patient's son. Justin Hull is a 89 y.o. male contacted today regarding refills of specialty medication(s) Darolutamide  (NUBEQA )   Patient requested Delivery   Delivery date: 06/13/24   Verified address: [address], ATTN: [place], [place], [place]   Medication will be filled on 06/12/24.

## 2024-06-07 NOTE — Progress Notes (Signed)
 Specialty Pharmacy Ongoing Clinical Assessment Note  I spoke with the patient's son.  The patient now resides in an assisted living facility. Justin Hull is a 89 y.o. male who is being followed by the specialty pharmacy service for RxSp Oncology   Patient's specialty medication(s) reviewed today: Darolutamide  (NUBEQA )   Missed doses in the last 4 weeks: 0   Patient/Caregiver did not have any additional questions or concerns.   Therapeutic benefit summary: Unable to assess   Adverse events/side effects summary: Experienced adverse events/side effects (patient's son reports some mild weight loss, though he reports that his father maintains a good appetite)   Patient's therapy is appropriate to: Continue    Goals Addressed             This Visit's Progress    Maintain optimal adherence to therapy   On track    Patient is initiating therapy. Patient will maintain adherence         Follow up: 3 months  [name] Specialty Pharmacist

## 2024-06-08 DIAGNOSIS — Z87891 Personal history of nicotine dependence: Secondary | ICD-10-CM | POA: Diagnosis not present

## 2024-06-08 DIAGNOSIS — E785 Hyperlipidemia, unspecified: Secondary | ICD-10-CM | POA: Diagnosis not present

## 2024-06-08 DIAGNOSIS — C772 Secondary and unspecified malignant neoplasm of intra-abdominal lymph nodes: Secondary | ICD-10-CM | POA: Diagnosis not present

## 2024-06-08 DIAGNOSIS — H409 Unspecified glaucoma: Secondary | ICD-10-CM | POA: Diagnosis not present

## 2024-06-08 DIAGNOSIS — I129 Hypertensive chronic kidney disease with stage 1 through stage 4 chronic kidney disease, or unspecified chronic kidney disease: Secondary | ICD-10-CM | POA: Diagnosis not present

## 2024-06-08 DIAGNOSIS — Z7901 Long term (current) use of anticoagulants: Secondary | ICD-10-CM | POA: Diagnosis not present

## 2024-06-08 DIAGNOSIS — D6859 Other primary thrombophilia: Secondary | ICD-10-CM | POA: Diagnosis not present

## 2024-06-08 DIAGNOSIS — N189 Chronic kidney disease, unspecified: Secondary | ICD-10-CM | POA: Diagnosis not present

## 2024-06-08 DIAGNOSIS — H5462 Unqualified visual loss, left eye, normal vision right eye: Secondary | ICD-10-CM | POA: Diagnosis not present

## 2024-06-08 DIAGNOSIS — G4700 Insomnia, unspecified: Secondary | ICD-10-CM | POA: Diagnosis not present

## 2024-06-08 DIAGNOSIS — M1990 Unspecified osteoarthritis, unspecified site: Secondary | ICD-10-CM | POA: Diagnosis not present

## 2024-06-08 DIAGNOSIS — N1330 Unspecified hydronephrosis: Secondary | ICD-10-CM | POA: Diagnosis not present

## 2024-06-08 DIAGNOSIS — I4891 Unspecified atrial fibrillation: Secondary | ICD-10-CM | POA: Diagnosis not present

## 2024-06-08 DIAGNOSIS — K5900 Constipation, unspecified: Secondary | ICD-10-CM | POA: Diagnosis not present

## 2024-06-08 DIAGNOSIS — K219 Gastro-esophageal reflux disease without esophagitis: Secondary | ICD-10-CM | POA: Diagnosis not present

## 2024-06-08 DIAGNOSIS — E559 Vitamin D deficiency, unspecified: Secondary | ICD-10-CM | POA: Diagnosis not present

## 2024-06-08 DIAGNOSIS — C7951 Secondary malignant neoplasm of bone: Secondary | ICD-10-CM | POA: Diagnosis not present

## 2024-06-11 DIAGNOSIS — I4891 Unspecified atrial fibrillation: Secondary | ICD-10-CM | POA: Diagnosis not present

## 2024-06-11 DIAGNOSIS — Z87891 Personal history of nicotine dependence: Secondary | ICD-10-CM | POA: Diagnosis not present

## 2024-06-11 DIAGNOSIS — Z7901 Long term (current) use of anticoagulants: Secondary | ICD-10-CM | POA: Diagnosis not present

## 2024-06-11 DIAGNOSIS — E559 Vitamin D deficiency, unspecified: Secondary | ICD-10-CM | POA: Diagnosis not present

## 2024-06-11 DIAGNOSIS — K219 Gastro-esophageal reflux disease without esophagitis: Secondary | ICD-10-CM | POA: Diagnosis not present

## 2024-06-11 DIAGNOSIS — N189 Chronic kidney disease, unspecified: Secondary | ICD-10-CM | POA: Diagnosis not present

## 2024-06-11 DIAGNOSIS — I129 Hypertensive chronic kidney disease with stage 1 through stage 4 chronic kidney disease, or unspecified chronic kidney disease: Secondary | ICD-10-CM | POA: Diagnosis not present

## 2024-06-11 DIAGNOSIS — C772 Secondary and unspecified malignant neoplasm of intra-abdominal lymph nodes: Secondary | ICD-10-CM | POA: Diagnosis not present

## 2024-06-11 DIAGNOSIS — D6859 Other primary thrombophilia: Secondary | ICD-10-CM | POA: Diagnosis not present

## 2024-06-11 DIAGNOSIS — G4700 Insomnia, unspecified: Secondary | ICD-10-CM | POA: Diagnosis not present

## 2024-06-11 DIAGNOSIS — H5462 Unqualified visual loss, left eye, normal vision right eye: Secondary | ICD-10-CM | POA: Diagnosis not present

## 2024-06-11 DIAGNOSIS — E785 Hyperlipidemia, unspecified: Secondary | ICD-10-CM | POA: Diagnosis not present

## 2024-06-11 DIAGNOSIS — H409 Unspecified glaucoma: Secondary | ICD-10-CM | POA: Diagnosis not present

## 2024-06-11 DIAGNOSIS — N1330 Unspecified hydronephrosis: Secondary | ICD-10-CM | POA: Diagnosis not present

## 2024-06-11 DIAGNOSIS — K5900 Constipation, unspecified: Secondary | ICD-10-CM | POA: Diagnosis not present

## 2024-06-11 DIAGNOSIS — C7951 Secondary malignant neoplasm of bone: Secondary | ICD-10-CM | POA: Diagnosis not present

## 2024-06-11 DIAGNOSIS — M1990 Unspecified osteoarthritis, unspecified site: Secondary | ICD-10-CM | POA: Diagnosis not present

## 2024-06-12 ENCOUNTER — Other Ambulatory Visit (HOSPITAL_COMMUNITY): Payer: Self-pay

## 2024-06-12 ENCOUNTER — Other Ambulatory Visit: Payer: Self-pay

## 2024-06-13 DIAGNOSIS — H5462 Unqualified visual loss, left eye, normal vision right eye: Secondary | ICD-10-CM | POA: Diagnosis not present

## 2024-06-13 DIAGNOSIS — I4891 Unspecified atrial fibrillation: Secondary | ICD-10-CM | POA: Diagnosis not present

## 2024-06-13 DIAGNOSIS — K219 Gastro-esophageal reflux disease without esophagitis: Secondary | ICD-10-CM | POA: Diagnosis not present

## 2024-06-13 DIAGNOSIS — E559 Vitamin D deficiency, unspecified: Secondary | ICD-10-CM | POA: Diagnosis not present

## 2024-06-13 DIAGNOSIS — D6859 Other primary thrombophilia: Secondary | ICD-10-CM | POA: Diagnosis not present

## 2024-06-13 DIAGNOSIS — K5900 Constipation, unspecified: Secondary | ICD-10-CM | POA: Diagnosis not present

## 2024-06-13 DIAGNOSIS — M1990 Unspecified osteoarthritis, unspecified site: Secondary | ICD-10-CM | POA: Diagnosis not present

## 2024-06-13 DIAGNOSIS — E785 Hyperlipidemia, unspecified: Secondary | ICD-10-CM | POA: Diagnosis not present

## 2024-06-13 DIAGNOSIS — H409 Unspecified glaucoma: Secondary | ICD-10-CM | POA: Diagnosis not present

## 2024-06-13 DIAGNOSIS — Z87891 Personal history of nicotine dependence: Secondary | ICD-10-CM | POA: Diagnosis not present

## 2024-06-13 DIAGNOSIS — Z7901 Long term (current) use of anticoagulants: Secondary | ICD-10-CM | POA: Diagnosis not present

## 2024-06-13 DIAGNOSIS — C772 Secondary and unspecified malignant neoplasm of intra-abdominal lymph nodes: Secondary | ICD-10-CM | POA: Diagnosis not present

## 2024-06-13 DIAGNOSIS — N1330 Unspecified hydronephrosis: Secondary | ICD-10-CM | POA: Diagnosis not present

## 2024-06-13 DIAGNOSIS — N189 Chronic kidney disease, unspecified: Secondary | ICD-10-CM | POA: Diagnosis not present

## 2024-06-13 DIAGNOSIS — C7951 Secondary malignant neoplasm of bone: Secondary | ICD-10-CM | POA: Diagnosis not present

## 2024-06-13 DIAGNOSIS — I129 Hypertensive chronic kidney disease with stage 1 through stage 4 chronic kidney disease, or unspecified chronic kidney disease: Secondary | ICD-10-CM | POA: Diagnosis not present

## 2024-06-13 DIAGNOSIS — G4700 Insomnia, unspecified: Secondary | ICD-10-CM | POA: Diagnosis not present

## 2024-06-15 ENCOUNTER — Telehealth: Payer: Self-pay

## 2024-06-15 DIAGNOSIS — I4891 Unspecified atrial fibrillation: Secondary | ICD-10-CM | POA: Diagnosis not present

## 2024-06-15 DIAGNOSIS — G4700 Insomnia, unspecified: Secondary | ICD-10-CM | POA: Diagnosis not present

## 2024-06-15 DIAGNOSIS — H409 Unspecified glaucoma: Secondary | ICD-10-CM | POA: Diagnosis not present

## 2024-06-15 DIAGNOSIS — I129 Hypertensive chronic kidney disease with stage 1 through stage 4 chronic kidney disease, or unspecified chronic kidney disease: Secondary | ICD-10-CM | POA: Diagnosis not present

## 2024-06-15 DIAGNOSIS — C7951 Secondary malignant neoplasm of bone: Secondary | ICD-10-CM | POA: Diagnosis not present

## 2024-06-15 DIAGNOSIS — M1990 Unspecified osteoarthritis, unspecified site: Secondary | ICD-10-CM | POA: Diagnosis not present

## 2024-06-15 DIAGNOSIS — C772 Secondary and unspecified malignant neoplasm of intra-abdominal lymph nodes: Secondary | ICD-10-CM | POA: Diagnosis not present

## 2024-06-15 DIAGNOSIS — N189 Chronic kidney disease, unspecified: Secondary | ICD-10-CM | POA: Diagnosis not present

## 2024-06-15 NOTE — Telephone Encounter (Signed)
 Pt is scheduled to be given the Nubeqa  @ 8am and 5pm. No missed doses. I reminded Lizzy that pt shouldn't have any grapefruit or grapefruit juices. If he were to develop temp of 100.4 or higher, they should call us  day or night. She verbalized understanding. I also told her to watch his blood pressure, as elevated BP has been seen with this drug. Lizzy states, Ok. He is very good at voicing any concerns he has. We confirmed his next 2 appt's.

## 2024-06-18 DIAGNOSIS — C7951 Secondary malignant neoplasm of bone: Secondary | ICD-10-CM | POA: Diagnosis not present

## 2024-06-18 DIAGNOSIS — I129 Hypertensive chronic kidney disease with stage 1 through stage 4 chronic kidney disease, or unspecified chronic kidney disease: Secondary | ICD-10-CM | POA: Diagnosis not present

## 2024-06-18 DIAGNOSIS — Z7901 Long term (current) use of anticoagulants: Secondary | ICD-10-CM | POA: Diagnosis not present

## 2024-06-18 DIAGNOSIS — M1990 Unspecified osteoarthritis, unspecified site: Secondary | ICD-10-CM | POA: Diagnosis not present

## 2024-06-18 DIAGNOSIS — N189 Chronic kidney disease, unspecified: Secondary | ICD-10-CM | POA: Diagnosis not present

## 2024-06-18 DIAGNOSIS — K219 Gastro-esophageal reflux disease without esophagitis: Secondary | ICD-10-CM | POA: Diagnosis not present

## 2024-06-18 DIAGNOSIS — E559 Vitamin D deficiency, unspecified: Secondary | ICD-10-CM | POA: Diagnosis not present

## 2024-06-18 DIAGNOSIS — H409 Unspecified glaucoma: Secondary | ICD-10-CM | POA: Diagnosis not present

## 2024-06-18 DIAGNOSIS — C772 Secondary and unspecified malignant neoplasm of intra-abdominal lymph nodes: Secondary | ICD-10-CM | POA: Diagnosis not present

## 2024-06-18 DIAGNOSIS — I4891 Unspecified atrial fibrillation: Secondary | ICD-10-CM | POA: Diagnosis not present

## 2024-06-18 DIAGNOSIS — N1330 Unspecified hydronephrosis: Secondary | ICD-10-CM | POA: Diagnosis not present

## 2024-06-18 DIAGNOSIS — H5462 Unqualified visual loss, left eye, normal vision right eye: Secondary | ICD-10-CM | POA: Diagnosis not present

## 2024-06-18 DIAGNOSIS — K5900 Constipation, unspecified: Secondary | ICD-10-CM | POA: Diagnosis not present

## 2024-06-18 DIAGNOSIS — Z87891 Personal history of nicotine dependence: Secondary | ICD-10-CM | POA: Diagnosis not present

## 2024-06-18 DIAGNOSIS — G4700 Insomnia, unspecified: Secondary | ICD-10-CM | POA: Diagnosis not present

## 2024-06-18 DIAGNOSIS — E785 Hyperlipidemia, unspecified: Secondary | ICD-10-CM | POA: Diagnosis not present

## 2024-06-18 DIAGNOSIS — D6859 Other primary thrombophilia: Secondary | ICD-10-CM | POA: Diagnosis not present

## 2024-06-21 ENCOUNTER — Encounter (INDEPENDENT_AMBULATORY_CARE_PROVIDER_SITE_OTHER): Admitting: Ophthalmology

## 2024-06-22 ENCOUNTER — Inpatient Hospital Stay: Attending: Oncology

## 2024-06-22 DIAGNOSIS — Z79899 Other long term (current) drug therapy: Secondary | ICD-10-CM | POA: Insufficient documentation

## 2024-06-22 DIAGNOSIS — C61 Malignant neoplasm of prostate: Secondary | ICD-10-CM | POA: Insufficient documentation

## 2024-06-22 DIAGNOSIS — C7951 Secondary malignant neoplasm of bone: Secondary | ICD-10-CM | POA: Insufficient documentation

## 2024-06-22 LAB — CBC WITH DIFFERENTIAL (CANCER CENTER ONLY)
Abs Immature Granulocytes: 0.01 K/uL (ref 0.00–0.07)
Basophils Absolute: 0.1 K/uL (ref 0.0–0.1)
Basophils Relative: 1 %
Eosinophils Absolute: 1.4 K/uL — ABNORMAL HIGH (ref 0.0–0.5)
Eosinophils Relative: 16 %
HCT: 35.7 % — ABNORMAL LOW (ref 39.0–52.0)
Hemoglobin: 11.7 g/dL — ABNORMAL LOW (ref 13.0–17.0)
Immature Granulocytes: 0 %
Lymphocytes Relative: 20 %
Lymphs Abs: 1.7 K/uL (ref 0.7–4.0)
MCH: 30.2 pg (ref 26.0–34.0)
MCHC: 32.8 g/dL (ref 30.0–36.0)
MCV: 92.2 fL (ref 80.0–100.0)
Monocytes Absolute: 0.7 K/uL (ref 0.1–1.0)
Monocytes Relative: 8 %
Neutro Abs: 4.8 K/uL (ref 1.7–7.7)
Neutrophils Relative %: 55 %
Platelet Count: 134 K/uL — ABNORMAL LOW (ref 150–400)
RBC: 3.87 MIL/uL — ABNORMAL LOW (ref 4.22–5.81)
RDW: 15.8 % — ABNORMAL HIGH (ref 11.5–15.5)
WBC Count: 8.6 K/uL (ref 4.0–10.5)
nRBC: 0 % (ref 0.0–0.2)

## 2024-06-22 LAB — CMP (CANCER CENTER ONLY)
ALT: 20 U/L (ref 0–44)
AST: 30 U/L (ref 15–41)
Albumin: 4.4 g/dL (ref 3.5–5.0)
Alkaline Phosphatase: 89 U/L (ref 38–126)
Anion gap: 10 (ref 5–15)
BUN: 28 mg/dL — ABNORMAL HIGH (ref 8–23)
CO2: 22 mmol/L (ref 22–32)
Calcium: 9.2 mg/dL (ref 8.9–10.3)
Chloride: 106 mmol/L (ref 98–111)
Creatinine: 1.84 mg/dL — ABNORMAL HIGH (ref 0.61–1.24)
GFR, Estimated: 34 mL/min — ABNORMAL LOW (ref >60)
Glucose, Bld: 84 mg/dL (ref 70–99)
Potassium: 4.4 mmol/L (ref 3.5–5.1)
Sodium: 138 mmol/L (ref 135–145)
Total Bilirubin: 0.6 mg/dL (ref 0.0–1.2)
Total Protein: 6.9 g/dL (ref 6.5–8.1)

## 2024-06-22 LAB — PSA: Prostatic Specific Antigen: 2.76 ng/mL (ref 0.00–4.00)

## 2024-06-24 NOTE — Progress Notes (Unsigned)
 [facility] at [facility] [address] [place],  [place]  [address] [phone]  Clinic Day:  05/14/2024  Referring physician: Rusty Lonni HERO, *   HISTORY OF PRESENT ILLNESS:  The patient is a 89 y.o. male  who I was asked to consult upon for metastatic prostate cancer.   He was initially diagnosed with prostate cancer for which he underwent a radical prostatectomy in February 2003.  Over years, his PSA progressively rose to 22.59.  This led to A PSMA PET scan being done in August 2025, which showed extensive metastasis, including bulky lymphadenopathy in his pelvic region that abutted against his left lateral bladder, leading to hydronephrosis.  Foci of metastatic bone disease, as well as retroperitoneal, hilar and mediastinal lymphadenopathy, were also seen.  As these findings were consistent with metastatic prostate cancer, the patient comes in today to discuss a palliative treatment plan.  Of note, he has already received a Firmagon injection at his urologist's office.  He also is on Casodex at this time to minimize any type of flare reaction from his androgen deprivation therapy.  Overall, the patient claims to be doing well.  Other than sporadic lower back pain, his daily quality of life, as it pertains to his metastatic prostate cancer, is decent.  PAST MEDICAL HISTORY:   Past Medical History:  Diagnosis Date   Atrial fibrillation (HCC) 03/13/2015   Essential hypertension 03/13/2015   Prostate cancer (HCC)     PAST SURGICAL HISTORY:   Past Surgical History:  Procedure Laterality Date   BUCCAL MASS EXCISION Right 2019   DONE IN [place], [place]   HERNIA REPAIR Left    INGUINAL   PROSTATECTOMY      CURRENT MEDICATIONS:   Current Outpatient Medications  Medication Sig Dispense Refill   amLODipine (NORVASC) 2.5 MG tablet Take 2.5 mg by mouth daily.     apixaban (ELIQUIS) 5 MG TABS tablet Take 5 mg by mouth 2 (two) times daily.     benazepril  (LOTENSIN) 40 MG tablet Take 40 mg by mouth daily.     calcium carbonate (OSCAL) 1500 (600 Ca) MG TABS tablet Take 600 mg of elemental calcium by mouth 2 (two) times daily with a meal.     Cholecalciferol (VITAMIN D3) 1000 units CAPS Take 1,000 Units by mouth daily.     COMBIGAN 0.2-0.5 % ophthalmic solution instil 1 drop in right eye twice a day  6   darolutamide  (NUBEQA ) 300 MG tablet Take 1 tablet (300 mg total) by mouth 2 (two) times daily with a meal. 60 tablet 4   dorzolamide (TRUSOPT) 2 % ophthalmic solution Place 1 drop into the right eye 2 (two) times daily.     GEMTESA 75 MG TABS SMARTSIG:1 Tablet(s) By Mouth Every Evening     LORazepam (ATIVAN) 2 MG tablet Take 1-2 mg by mouth at bedtime as needed.     omeprazole (PRILOSEC) 40 MG capsule Take 40 mg by mouth daily.     prednisoLONE acetate (PRED FORTE) 1 % ophthalmic suspension Administer 1 drop into the left eye daily at 0600.     ROCKLATAN 0.02-0.005 % SOLN Place 1 drop into the right eye at bedtime.     senna (SENOKOT) 8.6 MG TABS tablet Take 2 tablets by mouth at bedtime.     solifenacin (VESICARE) 5 MG tablet SMARTSIG:1 Tablet(s) By Mouth Every Evening     traZODone (DESYREL) 50 MG tablet Take 50 mg by mouth at bedtime.  vitamin E 100 UNIT capsule Take 100 Units by mouth daily.     No current facility-administered medications for this visit.    ALLERGIES:  No Known Allergies  FAMILY HISTORY:   Family History  Problem Relation Age of Onset   Heart attack Father    Hypertension Father    Dementia Sister    Diabetes Sister    Heart disease Sister    Heart attack Brother    Heart disease Brother    Diabetes Brother    Prostate cancer Brother     SOCIAL HISTORY:  The patient was born and raised in [place].  He currently lives in [place].  He is widowed; he was previously married for 60 years.  He has 2 children, 5 grandchildren, and multiple great-grandchildren.  He was a actuary for 16 years.   He also operated a service station.  He was also a milkman in the remote past.  REVIEW OF SYSTEMS:  Review of Systems  Constitutional:  Negative for fatigue, fever and unexpected weight change.  Respiratory:  Negative for chest tightness, cough, hemoptysis and shortness of breath.   Cardiovascular:  Negative for chest pain and palpitations.  Gastrointestinal:  Negative for abdominal distention, abdominal pain, blood in stool, constipation, diarrhea, nausea and vomiting.  Genitourinary:  Negative for dysuria, frequency and hematuria.   Musculoskeletal:  Negative for arthralgias, back pain and myalgias.  Skin:  Negative for itching and rash.  Neurological:  Negative for dizziness, headaches and light-headedness.  Psychiatric/Behavioral:  Negative for depression and suicidal ideas. The patient is not nervous/anxious.      PHYSICAL EXAM:  There were no vitals taken for this visit. Wt Readings from Last 3 Encounters:  05/14/24 141 lb (64 kg)  03/24/17 172 lb 6.4 oz (78.2 kg)   There is no height or weight on file to calculate BMI. Performance status (ECOG): 1 - Symptomatic but completely ambulatory Physical Exam Constitutional:      Appearance: Normal appearance. He is not ill-appearing.     Comments: A pleasant older gentleman who is wearing dark sunglasses; he is moderately thin  HENT:     Mouth/Throat:     Mouth: Mucous membranes are moist.     Pharynx: Oropharynx is clear. No oropharyngeal exudate or posterior oropharyngeal erythema.  Cardiovascular:     Rate and Rhythm: Normal rate and regular rhythm.     Heart sounds: No murmur heard.    No friction rub. No gallop.  Pulmonary:     Effort: Pulmonary effort is normal. No respiratory distress.     Breath sounds: Normal breath sounds. No wheezing, rhonchi or rales.  Abdominal:     General: Bowel sounds are normal. There is no distension.     Palpations: Abdomen is soft. There is no mass.     Tenderness: There is no abdominal  tenderness.  Musculoskeletal:        General: No swelling.     Right lower leg: No edema.     Left lower leg: No edema.  Lymphadenopathy:     Cervical: No cervical adenopathy.     Upper Body:     Right upper body: No supraclavicular or axillary adenopathy.     Left upper body: No supraclavicular or axillary adenopathy.     Lower Body: No right inguinal adenopathy. No left inguinal adenopathy.  Skin:    General: Skin is warm.     Coloration: Skin is not jaundiced.     Findings: No  lesion or rash.  Neurological:     General: No focal deficit present.     Mental Status: He is alert and oriented to person, place, and time. Mental status is at baseline.  Psychiatric:        Mood and Affect: Mood normal.        Behavior: Behavior normal.        Thought Content: Thought content normal.    SCANS:  His PET scan from 04-08-24 revealed the following: FINDINGS: NECK   Asymmetry with absent physiologic right-sided parotid and submandibular activity, likely postsurgical given the clinical history of cheek cancer. No cervical nodal tracer affinity.   Incidental CT finding: Bilateral carotid atherosclerosis. Right maxillary sinus fluid. Left maxillary sinus mucous retention cyst or polyp.   CHEST   No pulmonary parenchymal tracer avid nodes. New tracer avid thoracic nodes. Example subcarinal station at 1.7 cm and a S.U.V. max of 8.7 on 60/4. Right hilar nodal affinity at a S.U.V. max of 9.4.   Incidental CT finding: Mild cardiomegaly. Aortic and coronary artery calcification.   ABDOMEN/PELVIS   Prostate: Significant progression of the left pelvic floor recurrence. Example at 5.1 x 6.1 cm and a S.U.V. max of 13.4 on 159/4. Comparison 1.9 x 1.7 cm on the prior. Direct extension into the left a side of the urinary bladder including on 156/4.   Lymph nodes: Pelvic node dissection. New tracer avid abdominal retroperitoneal nodes. Example aortocaval node at 9 mm and a S.U.V. max of  4.3 on 112/4.   Liver: No evidence of liver metastasis.   Incidental CT finding: Normal adrenal glands. Multiple gallstones. Right hepatic lobe hypoattenuating 2.3 cm lesion on 93/4 is not tracer avid but felt to be new since the prior PET.   Development of moderate left-sided hydroureteronephrosis, followed to the level of the left pelvic mass. Prostatectomy.   SKELETON   Multifocal tracer avid osseous metastasis are new. Example within the left acetabulum at a S.U.V. max of 8.6.   Within the inferior right sacrum at 1.9 cm and a S.U.V. max of 14.1 on 149/4.   IMPRESSION: 1. Significant disease progression since 06/16/2020. Enlargement of a left pelvic local recurrence with new bladder involvement and secondary left-sided hydroureteronephrosis. 2. Nodal metastasis within the mediastinum, right hilum, and abdominal retroperitoneum. 3. Moderate volume new osseous metastasis. 4. Right hepatic lobe 2.3 cm lesion is not tracer avid but felt to be new since 06/16/2020. Considerations include metastasis from a second primary or less likely a primary such as hepatocellular carcinoma. Of questionable clinical significance given patient age and comorbidities. If further imaging evaluation is desired, pre and post contrast abdominal MRI or FDG PET could be performed. 5. Incidental findings, including: Coronary artery atherosclerosis. Aortic Atherosclerosis (ICD10-I70.0). Cholelithiasis. Sinus disease.  LABS:      Latest Ref Rng & Units 06/22/2024   10:37 AM 05/14/2024    1:31 PM  CBC  WBC 4.0 - 10.5 K/uL 8.6  8.1   Hemoglobin 13.0 - 17.0 g/dL 88.2  89.6   Hematocrit 39.0 - 52.0 % 35.7  31.9   Platelets 150 - 400 K/uL 134  310       Latest Ref Rng & Units 06/22/2024   10:37 AM 05/14/2024    1:31 PM  CMP  Glucose 70 - 99 mg/dL 84  98   BUN 8 - 23 mg/dL 28  35   Creatinine 9.38 - 1.24 mg/dL 8.15  7.99   Sodium 864 - 145 mmol/L 138  137   Potassium 3.5 - 5.1 mmol/L 4.4  5.0    Chloride 98 - 111 mmol/L 106  103   CO2 22 - 32 mmol/L 22  22   Calcium 8.9 - 10.3 mg/dL 9.2  89.9   Total Protein 6.5 - 8.1 g/dL 6.9  7.6   Total Bilirubin 0.0 - 1.2 mg/dL 0.6  0.4   Alkaline Phos 38 - 126 U/L 89  125   AST 15 - 41 U/L 30  25   ALT 0 - 44 U/L 20  12     Latest Reference Range & Units 05/14/24 13:30 06/22/24 10:37  Prostatic Specific Antigen 0.00 - 4.00 ng/mL 18.33 (H) 2.76  (H): Data is abnormally high  ASSESSMENT & PLAN:  A 89 y.o. male who I was asked to consult upon for metastatic prostate cancer.  As mentioned previously, a PSMA PET scan recently done showed evidence of metastasis to pelvic, retroperitoneal, hilar, and mediastinal lymph nodes.  There was also evidence of osseous metastasis.  Furthermore, as mentioned previously, this gentleman has already started Firmagon therapy for androgen deprivation.  He is also taking Casodex briefly to likely offset any flare reaction from his Firmagon.  Ultimately, I do want to give this gentleman more potent complete androgen blockade therapy to further decrease his metastatic disease burden.  The antiandrogen I will place this gentleman on would be darolutamide , which be given at 300 mg twice daily.  This dose is being given based upon his reduced creatinine clearance.  He will take this in conjunction with his Firmagon to establish complete androgen blockade therapy.  Based upon his PSA level being slightly lower today than what it was at the time his PSMA PET scan was done, this likely reflects the modest benefit he has already received from his Firmagon therapy.  As he does have metastatic bone disease, I will arrange for him to receive Xgeva  injections at least once every 6 weeks to protect against future bone fractures.  His darolutamide  will be mailed to him via the [facility] outpatient pharmacy.  I will tentatively see this patient back in 6 weeks to reassess his PSA to see how well he is responding to his  Firmagon/darolutamide  regimen.  The patient understands all the plans discussed today and knows to contact office before his next visit if he runs into any problems that require immediate clinical attention.  I do appreciate Street, Lonni HERO, * for his new consult.   Calem Cocozza DELENA Kerns, MD

## 2024-06-25 ENCOUNTER — Inpatient Hospital Stay: Admitting: Oncology

## 2024-06-25 ENCOUNTER — Telehealth: Payer: Self-pay

## 2024-06-25 ENCOUNTER — Other Ambulatory Visit: Payer: Self-pay | Admitting: Oncology

## 2024-06-25 ENCOUNTER — Inpatient Hospital Stay

## 2024-06-25 VITALS — BP 128/81 | HR 60 | Temp 97.6°F | Resp 16 | Ht 70.0 in | Wt 139.8 lb

## 2024-06-25 DIAGNOSIS — C7951 Secondary malignant neoplasm of bone: Secondary | ICD-10-CM | POA: Diagnosis not present

## 2024-06-25 DIAGNOSIS — C61 Malignant neoplasm of prostate: Secondary | ICD-10-CM

## 2024-06-25 MED ORDER — DENOSUMAB 120 MG/1.7ML ~~LOC~~ SOLN
120.0000 mg | Freq: Once | SUBCUTANEOUS | Status: AC
  Administered 2024-06-25: 120 mg via SUBCUTANEOUS
  Filled 2024-06-25: qty 1.7

## 2024-06-25 NOTE — Patient Instructions (Signed)
 Denosumab  Injection (Oncology) What is this medication? DENOSUMAB  (den oh SUE mab) prevents weakened bones caused by cancer. It may also be used to treat noncancerous bone tumors that cannot be removed by surgery. It can also be used to treat high calcium  levels in the blood caused by cancer. It works by blocking a protein that causes bones to break down quickly. This slows down the release of calcium  from bones, which lowers calcium  levels in your blood. It also makes your bones stronger and less likely to break (fracture). This medicine may be used for other purposes; ask your health care provider or pharmacist if you have questions. COMMON BRAND NAME(S): XGEVA  What should I tell my care team before I take this medication? They need to know if you have any of these conditions: Dental disease Having surgery or tooth extraction Infection Kidney disease Low levels of calcium  or vitamin D  in the blood Malnutrition On hemodialysis Skin conditions or sensitivity Thyroid  or parathyroid disease An unusual reaction to denosumab , other medications, foods, dyes, or preservatives Pregnant or trying to get pregnant Breast-feeding How should I use this medication? This medication is for injection under the skin. It is given by your care team in a hospital or clinic setting. A special MedGuide will be given to you before each treatment. Be sure to read this information carefully each time. Talk to your care team about the use of this medication in children. While it may be prescribed for children as young as 13 years for selected conditions, precautions do apply. Overdosage: If you think you have taken too much of this medicine contact a poison control center or emergency room at once. NOTE: This medicine is only for you. Do not share this medicine with others. What if I miss a dose? Keep appointments for follow-up doses. It is important not to miss your dose. Call your care team if you are unable to  keep an appointment. What may interact with this medication? Do not take this medication with any of the following: Other medications containing denosumab  This medication may also interact with the following: Medications that lower your chance of fighting infection Steroid medications, such as prednisone  or cortisone This list may not describe all possible interactions. Give your health care provider a list of all the medicines, herbs, non-prescription drugs, or dietary supplements you use. Also tell them if you smoke, drink alcohol, or use illegal drugs. Some items may interact with your medicine. What should I watch for while using this medication? Your condition will be monitored carefully while you are receiving this medication. You may need blood work while taking this medication. This medication may increase your risk of getting an infection. Call your care team for advice if you get a fever, chills, sore throat, or other symptoms of a cold or flu. Do not treat yourself. Try to avoid being around people who are sick. You should make sure you get enough calcium  and vitamin D  while you are taking this medication, unless your care team tells you not to. Discuss the foods you eat and the vitamins you take with your care team. Some people who take this medication have severe bone, joint, or muscle pain. This medication may also increase your risk for jaw problems or a broken thigh bone. Tell your care team right away if you have severe pain in your jaw, bones, joints, or muscles. Tell your care team if you have any pain that does not go away or that gets worse. Talk  to your care team if you may be pregnant. Serious birth defects can occur if you take this medication during pregnancy and for 5 months after the last dose. You will need a negative pregnancy test before starting this medication. Contraception is recommended while taking this medication and for 5 months after the last dose. Your care team  can help you find the option that works for you. What side effects may I notice from receiving this medication? Side effects that you should report to your care team as soon as possible: Allergic reactions--skin rash, itching, hives, swelling of the face, lips, tongue, or throat Bone, joint, or muscle pain Low calcium  level--muscle pain or cramps, confusion, tingling, or numbness in the hands or feet Osteonecrosis of the jaw--pain, swelling, or redness in the mouth, numbness of the jaw, poor healing after dental work, unusual discharge from the mouth, visible bones in the mouth Side effects that usually do not require medical attention (report to your care team if they continue or are bothersome): Cough Diarrhea Fatigue Headache Nausea This list may not describe all possible side effects. Call your doctor for medical advice about side effects. You may report side effects to FDA at 1-800-FDA-1088. Where should I keep my medication? This medication is given in a hospital or clinic. It will not be stored at home. NOTE: This sheet is a summary. It may not cover all possible information. If you have questions about this medicine, talk to your doctor, pharmacist, or health care provider.  2024 Elsevier/Gold Standard (2022-01-06 00:00:00)

## 2024-06-25 NOTE — Telephone Encounter (Signed)
 Pt is here today with his son for visit with Dr. Ezzard. Pt has been living @ [facility] for about 4 weeks now, and is adjusting well. He states his quarters are nice, he gets 3 meals and they are really nice. They will help him anytime. They have med techs who pass out the medications for pt's. Pt takes his Nubeqa  twice day after meals. No missed doses. The only side effect he mentions is he has some constipation. I told them to give me a stool softener every day. I reminded him to avoid grapefruit or grapefruit juice. I also told he and his son that we need to know if he develops temp of 100.4 or higher, day or night. They both verbalized understanding. Pt is being seen @ [facility] for his eyesight issues. When questioned what he could see. He replied, I can see you there, but can't make your face out. His son states he has another appt at [facility] this week. Dr Ezzard came into room while I was speaking with pt and his son. He told them that Mr Oblinger PSA has came down from 18.3 to 2.76, since adding the daralutamide, which pt was thrilled to hear of course. Pt will continue to get the Firmagon and Xgeva  injections as ordered, & he will see him in 12 wks.

## 2024-07-05 ENCOUNTER — Other Ambulatory Visit (HOSPITAL_COMMUNITY): Payer: Self-pay

## 2024-07-09 ENCOUNTER — Other Ambulatory Visit: Payer: Self-pay

## 2024-07-09 NOTE — Progress Notes (Signed)
 Specialty Pharmacy Refill Coordination Note  Justin Hull is a 89 y.o. male contacted today regarding refills of specialty medication(s) Darolutamide  (NUBEQA )   Patient requested Delivery   Delivery date: 07/11/24   Verified address: [address] [name], [address]   Medication will be filled on: 07/10/24

## 2024-07-10 ENCOUNTER — Other Ambulatory Visit: Payer: Self-pay

## 2024-07-23 ENCOUNTER — Inpatient Hospital Stay: Attending: Oncology

## 2024-07-23 ENCOUNTER — Other Ambulatory Visit: Payer: Self-pay | Admitting: Hematology and Oncology

## 2024-07-23 ENCOUNTER — Inpatient Hospital Stay

## 2024-07-23 DIAGNOSIS — Z79899 Other long term (current) drug therapy: Secondary | ICD-10-CM | POA: Diagnosis not present

## 2024-07-23 DIAGNOSIS — C61 Malignant neoplasm of prostate: Secondary | ICD-10-CM

## 2024-07-23 LAB — COMPREHENSIVE METABOLIC PANEL WITH GFR
ALT: 9 U/L (ref 0–44)
AST: 25 U/L (ref 15–41)
Albumin: 3.7 g/dL (ref 3.5–5.0)
Alkaline Phosphatase: 103 U/L (ref 38–126)
Anion gap: 14 (ref 5–15)
BUN: 21 mg/dL (ref 8–23)
CO2: 16 mmol/L — ABNORMAL LOW (ref 22–32)
Calcium: 8 mg/dL — ABNORMAL LOW (ref 8.9–10.3)
Chloride: 107 mmol/L (ref 98–111)
Creatinine, Ser: 1.66 mg/dL — ABNORMAL HIGH (ref 0.61–1.24)
GFR, Estimated: 38 mL/min — ABNORMAL LOW (ref >60)
Glucose, Bld: 138 mg/dL — ABNORMAL HIGH (ref 70–99)
Potassium: 3 mmol/L — ABNORMAL LOW (ref 3.5–5.1)
Sodium: 137 mmol/L (ref 135–145)
Total Bilirubin: 0.6 mg/dL (ref 0.0–1.2)
Total Protein: 7.2 g/dL (ref 6.5–8.1)

## 2024-07-23 MED ORDER — POTASSIUM CHLORIDE CRYS ER 20 MEQ PO TBCR: 20.0000 meq | EXTENDED_RELEASE_TABLET | Freq: Two times a day (BID) | ORAL | 0 refills | Status: DC

## 2024-07-23 NOTE — Progress Notes (Signed)
 No shot today with calcium levels. PO potassium called into pharmacy. Patient instructed to take Imodium for diarrhea.

## 2024-07-29 DIAGNOSIS — I340 Nonrheumatic mitral (valve) insufficiency: Secondary | ICD-10-CM | POA: Diagnosis not present

## 2024-07-29 DIAGNOSIS — I351 Nonrheumatic aortic (valve) insufficiency: Secondary | ICD-10-CM | POA: Diagnosis not present

## 2024-07-29 DIAGNOSIS — I361 Nonrheumatic tricuspid (valve) insufficiency: Secondary | ICD-10-CM | POA: Diagnosis not present

## 2024-07-30 DIAGNOSIS — I959 Hypotension, unspecified: Secondary | ICD-10-CM | POA: Diagnosis not present

## 2024-07-30 DIAGNOSIS — I4891 Unspecified atrial fibrillation: Secondary | ICD-10-CM | POA: Diagnosis not present

## 2024-07-30 DIAGNOSIS — R001 Bradycardia, unspecified: Secondary | ICD-10-CM | POA: Diagnosis not present

## 2024-07-30 DIAGNOSIS — I482 Chronic atrial fibrillation, unspecified: Secondary | ICD-10-CM | POA: Diagnosis not present

## 2024-07-30 DIAGNOSIS — N1831 Chronic kidney disease, stage 3a: Secondary | ICD-10-CM | POA: Diagnosis not present

## 2024-07-31 DIAGNOSIS — R001 Bradycardia, unspecified: Secondary | ICD-10-CM | POA: Diagnosis not present

## 2024-07-31 DIAGNOSIS — N1831 Chronic kidney disease, stage 3a: Secondary | ICD-10-CM | POA: Diagnosis not present

## 2024-07-31 DIAGNOSIS — I959 Hypotension, unspecified: Secondary | ICD-10-CM | POA: Diagnosis not present

## 2024-07-31 DIAGNOSIS — I482 Chronic atrial fibrillation, unspecified: Secondary | ICD-10-CM | POA: Diagnosis not present

## 2024-08-01 ENCOUNTER — Telehealth: Payer: Self-pay

## 2024-08-01 DIAGNOSIS — N1831 Chronic kidney disease, stage 3a: Secondary | ICD-10-CM | POA: Diagnosis not present

## 2024-08-01 DIAGNOSIS — R001 Bradycardia, unspecified: Secondary | ICD-10-CM | POA: Diagnosis not present

## 2024-08-01 DIAGNOSIS — I482 Chronic atrial fibrillation, unspecified: Secondary | ICD-10-CM | POA: Diagnosis not present

## 2024-08-01 DIAGNOSIS — I959 Hypotension, unspecified: Secondary | ICD-10-CM | POA: Diagnosis not present

## 2024-08-01 NOTE — Telephone Encounter (Signed)
 Pt's son states he dad was not doing well this past Saturday (he had received call from [facility] that he had fall), so they had him taken to [facility] ER. Per reviewing hospital providers notes pt came in with AMS, w/ 2 falls that day - no head injury. He developed septic shock from UTI (e coli), requiring ICU admission. He became hypothermic (87.32F) - requiring Bear hugger, bradycardic (in 30s-40's) requiring pressors & Levophed drips, thrombocytopenic requiring Eliquis to be held. Pt now improving, hoping to be moved out of ICU today. Pt has not received any daralutamide during admission. He is back on low dose Eliquis per cardiology.  (Prior to this admission - pt was originally @ [facility] - had fall & was admitted on 07/04/24 to [facility] with left hip fx - d/c to [facility] for rehab, then d/c back to [facility] 07/27/24).  Dr Ezzard, Kaitlyn Schomburg,RPH, and Madelin Meres, CMA notified of all above.

## 2024-08-02 ENCOUNTER — Other Ambulatory Visit: Payer: Self-pay

## 2024-08-02 ENCOUNTER — Other Ambulatory Visit (HOSPITAL_COMMUNITY): Payer: Self-pay

## 2024-08-02 DIAGNOSIS — I482 Chronic atrial fibrillation, unspecified: Secondary | ICD-10-CM | POA: Diagnosis not present

## 2024-08-02 DIAGNOSIS — R001 Bradycardia, unspecified: Secondary | ICD-10-CM | POA: Diagnosis not present

## 2024-08-02 DIAGNOSIS — I959 Hypotension, unspecified: Secondary | ICD-10-CM | POA: Diagnosis not present

## 2024-08-02 DIAGNOSIS — N1831 Chronic kidney disease, stage 3a: Secondary | ICD-10-CM | POA: Diagnosis not present

## 2024-08-03 DIAGNOSIS — I959 Hypotension, unspecified: Secondary | ICD-10-CM | POA: Diagnosis not present

## 2024-08-03 DIAGNOSIS — N1831 Chronic kidney disease, stage 3a: Secondary | ICD-10-CM | POA: Diagnosis not present

## 2024-08-03 DIAGNOSIS — I482 Chronic atrial fibrillation, unspecified: Secondary | ICD-10-CM | POA: Diagnosis not present

## 2024-08-03 DIAGNOSIS — R001 Bradycardia, unspecified: Secondary | ICD-10-CM | POA: Diagnosis not present

## 2024-08-09 ENCOUNTER — Telehealth: Payer: Self-pay

## 2024-08-09 ENCOUNTER — Other Ambulatory Visit (HOSPITAL_COMMUNITY): Payer: Self-pay

## 2024-08-09 ENCOUNTER — Encounter: Payer: Self-pay | Admitting: Oncology

## 2024-08-09 NOTE — Telephone Encounter (Addendum)
 LVM on nurse line that pt has been recently hospitalized. Hospital discharge summary noted to f/u with urologist. Pt's son mentioned that Dr Ezzard may make change to the Daralutamide. Justin Hull is calling to get some clarification on the daralutamide. Pt is NOT taking at present. Message sent to Dr Ezzard, Eleanor P,NP, Kaitlyn Schomburg,RPH,and Tammy Beal,CMA.  Dr Ezzard states he plans to change daralutamide to Enzalutamide 40mg  4 tabs po qd. Pt is to keep his appt's for labs and injection on 08/20/2024.

## 2024-08-09 NOTE — Telephone Encounter (Signed)
 Oral Oncology Patient Advocate Encounter  After completing a benefits investigation, prior authorization for Xtandi 40mg  is not required at this time through [facility].  Patient's copay is $0.00.     Lucie Lamer, CPhT [place]  [facility] Specialty Pharmacy Services Oncology Pharmacy Patient Advocate Specialist [address] [place] Phone: [phone]  Fax: [phone] [email or web address]

## 2024-08-09 NOTE — Telephone Encounter (Signed)
 Opened in error

## 2024-08-10 ENCOUNTER — Encounter: Payer: Self-pay | Admitting: Oncology

## 2024-08-10 NOTE — Telephone Encounter (Signed)
 Oral Oncology Pharmacist Encounter  Received new prescription for Xtandi (enzalutamide) for the treatment of metastatic prostate cancer in conjunction with Firmagon and Xgeva , planned duration until disease progression or unacceptable toxicity..  Labs from 07/23/24 (CMP) and 06/22/24 (CBC) assessed, no dose adjustments needed for increased creatinine. Prescription has not been entered yet by MD, will addend when ordered.   Current medication list in Epic reviewed, DDIs with Xtandi identified: - eliquis (cat D): xtandi may singificantly decrease how effective Eliquis is. Sent message to MD to discuss with patients cardiologist at [facility]. Patient takes eliquis for Afib.  - trazodone (cat D): xtandi may decrease the effectiveness of trazodone. Will notify patient and caregiver to watch for any decreased effectiveness.  - omeprazole, amlodipine, and solifenacin (cat C): xtandi may decrease how effective these 3 medications work but to a lesser extent than above.Will have caregiver monitor for decreased efficacy of omeprazole, amlodipine, and solifenacin.   Evaluated chart and no patient barriers to medication adherence noted.   Prescription has been e-scribed to the [facility] for benefits analysis and approval.  Oral Oncology Clinic will continue to follow for insurance authorization, copayment issues, initial counseling and start date.  Georgeanne Frankland, PharmD Hematology/Oncology Clinical Pharmacist [facility] [phone] 08/10/2024 9:30 AM

## 2024-08-15 ENCOUNTER — Ambulatory Visit

## 2024-08-15 ENCOUNTER — Telehealth: Payer: Self-pay

## 2024-08-15 ENCOUNTER — Encounter: Payer: Self-pay | Admitting: Oncology

## 2024-08-15 VITALS — BP 100/40 | HR 47 | Ht 70.0 in | Wt 143.0 lb

## 2024-08-15 DIAGNOSIS — I5032 Chronic diastolic (congestive) heart failure: Secondary | ICD-10-CM | POA: Diagnosis not present

## 2024-08-15 DIAGNOSIS — I4891 Unspecified atrial fibrillation: Secondary | ICD-10-CM | POA: Diagnosis not present

## 2024-08-15 DIAGNOSIS — I10 Essential (primary) hypertension: Secondary | ICD-10-CM

## 2024-08-15 DIAGNOSIS — Z01810 Encounter for preprocedural cardiovascular examination: Secondary | ICD-10-CM | POA: Insufficient documentation

## 2024-08-15 MED ORDER — AMLODIPINE BESYLATE 2.5 MG PO TABS: 2.5000 mg | ORAL_TABLET | Freq: Every day | ORAL | 3 refills | Status: DC

## 2024-08-15 NOTE — Assessment & Plan Note (Signed)
 Permanent atrial fibrillation, slow ventricular rate response here in the office based on EKG. No further symptoms of lightheadedness or falls at this time and deemed no indication for pacemaker during his inpatient stay at [facility] for multiple days where he was monitored on telemetry.  Remains on low-dose apixaban 2.5 mg twice daily. Continue the same.  Okay to hold as needed for procedures.

## 2024-08-15 NOTE — Patient Instructions (Signed)
 Medication Instructions:  Your physician has recommended you make the following change in your medication:   Decrease your Amlodipine  to 2.5 mg daily  *If you need a refill on your cardiac medications before your next appointment, please call your pharmacy*   Lab Work: None ordered If you have labs (blood work) drawn today and your tests are completely normal, you will receive your results only by: MyChart Message (if you have MyChart) OR A paper copy in the mail If you have any lab test that is abnormal or we need to change your treatment, we will call you to review the results.  Testing/Procedures: Your physician has requested that you have an echocardiogram. Echocardiography is a painless test that uses sound waves to create images of your heart. It provides your doctor with information about the size and shape of your heart and how well your hearts chambers and valves are working. This procedure takes approximately one hour. There are no restrictions for this procedure. Please do NOT wear cologne, perfume, aftershave, or lotions (deodorant is allowed). Please arrive 15 minutes prior to your appointment time.  Please note: We ask at that you not bring children with you during ultrasound (echo/ vascular) testing. Due to room size and safety concerns, children are not allowed in the ultrasound rooms during exams. Our front office staff cannot provide observation of children in our lobby area while testing is being conducted. An adult accompanying a patient to their appointment will only be allowed in the ultrasound room at the discretion of the ultrasound technician under special circumstances. We apologize for any inconvenience.  Follow-Up: At [facility], you and your health needs are our priority.  As part of our continuing mission to provide you with exceptional heart care, we have created designated Provider Care Teams.  These Care Teams include your primary Cardiologist (physician)  and Advanced Practice Providers (APPs -  Physician Assistants and Nurse Practitioners) who all work together to provide you with the care you need, when you need it.  We recommend signing up for the patient portal called MyChart.  Sign up information is provided on this After Visit Summary.  MyChart is used to connect with patients for Virtual Visits (Telemedicine).  Patients are able to view lab/test results, encounter notes, upcoming appointments, etc.  Non-urgent messages can be sent to your provider as well.   To learn more about what you can do with MyChart, go to [email or web address].    Your next appointment:   2 month(s)  The format for your next appointment:   In Person  Provider:   Alean Madireddy, MD   Other Instructions Echocardiogram An echocardiogram is a test that uses sound waves (ultrasound) to produce images of the heart. Images from an echocardiogram can provide important information about: Heart size and shape. The size and thickness and movement of your heart's walls. Heart muscle function and strength. Heart valve function or if you have stenosis. Stenosis is when the heart valves are too narrow. If blood is flowing backward through the heart valves (regurgitation). A tumor or infectious growth around the heart valves. Areas of heart muscle that are not working well because of poor blood flow or injury from a heart attack. Aneurysm detection. An aneurysm is a weak or damaged part of an artery wall. The wall bulges out from the normal force of blood pumping through the body. Tell a health care provider about: Any allergies you have. All medicines you are taking, including vitamins, herbs, eye drops,  creams, and over-the-counter medicines. Any blood disorders you have. Any surgeries you have had. Any medical conditions you have. Whether you are pregnant or may be pregnant. What are the risks? Generally, this is a safe test. However, problems may occur,  including an allergic reaction to dye (contrast) that may be used during the test. What happens before the test? No specific preparation is needed. You may eat and drink normally. What happens during the test? You will take off your clothes from the waist up and put on a hospital gown. Electrodes or electrocardiogram (ECG)patches may be placed on your chest. The electrodes or patches are then connected to a device that monitors your heart rate and rhythm. You will lie down on a table for an ultrasound exam. A gel will be applied to your chest to help sound waves pass through your skin. A handheld device, called a transducer, will be pressed against your chest and moved over your heart. The transducer produces sound waves that travel to your heart and bounce back (or echo back) to the transducer. These sound waves will be captured in real-time and changed into images of your heart that can be viewed on a video monitor. The images will be recorded on a computer and reviewed by your health care provider. You may be asked to change positions or hold your breath for a short time. This makes it easier to get different views or better views of your heart. In some cases, you may receive contrast through an IV in one of your veins. This can improve the quality of the pictures from your heart. The procedure may vary among health care providers and hospitals.   What can I expect after the test? You may return to your normal, everyday life, including diet, activities, and medicines, unless your health care provider tells you not to do that. Follow these instructions at home: It is up to you to get the results of your test. Ask your health care provider, or the department that is doing the test, when your results will be ready. Keep all follow-up visits. This is important. Summary An echocardiogram is a test that uses sound waves (ultrasound) to produce images of the heart. Images from an echocardiogram can  provide important information about the size and shape of your heart, heart muscle function, heart valve function, and other possible heart problems. You do not need to do anything to prepare before this test. You may eat and drink normally. After the echocardiogram is completed, you may return to your normal, everyday life, unless your health care provider tells you not to do that. This information is not intended to replace advice given to you by your health care provider. Make sure you discuss any questions you have with your health care provider. Document Revised: 04/08/2020 Document Reviewed: 04/08/2020 Elsevier Patient Education  2021 Elsevier Inc.   Important Information About Sugar

## 2024-08-15 NOTE — Assessment & Plan Note (Signed)
 Clinical features highly suggestive of diastolic CHF. Recent echocardiogram 07/29/2024 at [facility] noted normal LVEF 60 to 65%, mildly dilated RV with normal function, severe biatrial dilatation, mild to moderate eccentric mitral regurgitation jet, mild aortic insufficiency, mild to moderate TR with estimated RVSP 39 mmHg   This was in the setting of acute UTI/sepsis.  Will reassess cardiac structure and function and valve function with repeat echocardiogram.  He does appear mildly hypervolemic today with bilateral lower extremity dependent pedal edema. Currently not on any loop diuretic.  Has been on potassium supplementation given hypokalemia while admission to the hospital and being managed by oncology and primary care.  Keep salt intake below 2 g/day. Keep feet elevated and use compression socks if possible.  Will have him return to clinic for follow-up tentatively in 2 months. Further medication optimization based on echocardiogram results electrolytes and his clinical status. Not a candidate for beta-blockers due to slow heart rates. Not a candidate for ACE inhibitor/ARB's, Entresto, SGLT2 inhibitor due to baseline renal function and recent UTI.

## 2024-08-15 NOTE — Assessment & Plan Note (Signed)
 Well-controlled.  In fact on the lower end and soft today in the office. He is currently on amlodipine  10 mg once daily. Given his advanced age and relatively low blood pressures we will lower the dose of amlodipine  to 2.5 mg once daily. This can also be discontinued further as needed if blood pressures remain well-controlled.

## 2024-08-15 NOTE — Telephone Encounter (Signed)
 Pt c/o medication issue:  1. Name of Medication:   ELIQUIS 2.5 MG TABS tablet   2. How are you currently taking this medication (dosage and times per day)?   3. Are you having a reaction (difficulty breathing--STAT)?   4. What is your medication issue?   Caller ([name]) stated they have added a new cancer treatment medication (Xtandi) and it will decrease the effectiveness of patient's Eliquis.  Caller was a call back to discuss this medication change.

## 2024-08-15 NOTE — Progress Notes (Addendum)
 Cardiology Consultation:    Date:  08/15/2024   ID:  Justin Hull, DOB 12-Dec-1929, MRN [id number]  PCP:  Street, Lonni HERO, MD  Cardiologist:  Alean SAUNDERS Adamariz Gillott, MD   Referring MD: Street, Lonni HERO, MD   No chief complaint on file.    ASSESSMENT AND PLAN:   Mr Justin Hull 89 year old male pleasant man here for the visit from Accompanied by His Son. 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  Being planned for Date/Time: 08/16/2024  Procedure: Procedure(s): AQUEOUS SHUNT TO EXTRAOCULAR EQUATORIAL PLATE RESERVOIR, EXTERNAL APPROACH; WITH GRAFT  [facility]  Surgeon(s): Lanna Glean Agent, MD   Sustained a fall and left hip fracture requiring hemiarthroplasty 07/05/2024 at [facility], discharged to [facility] and after rehab went back to assisted living where he sustained another fall readmitted and treated for UTI, sepsis and noted to have metabolic derangements and slow heart rates with underlying A-fib seen by inpatient cardiology Dr. Vevelyn, timolol was discontinued, he remained hemodynamically stable and echocardiogram 07/29/2024 noted normal LVEF 60 to 65%, mildly dilated RV with normal function, severe biatrial dilatation, mild to moderate eccentric mitral regurgitation jet, mild aortic insufficiency, mild to moderate TR with estimated RVSP 39 mmHg  Here to establish care and follow-up. Problem List Items Addressed This Visit       Cardiovascular and Mediastinum   Atrial fibrillation (HCC)    Permanent atrial fibrillation, slow ventricular rate response here in the office based on EKG. No further symptoms of lightheadedness or falls at this time and deemed no indication for pacemaker during his inpatient stay at [facility] for multiple days where he was monitored on telemetry.  Remains on low-dose apixaban 2.5 mg twice daily. Continue the same.  Okay to hold as needed for procedures.       Relevant Medications   ELIQUIS 2.5 MG TABS tablet   amLODipine  (NORVASC ) 2.5 MG tablet   Other Relevant Orders   ECHOCARDIOGRAM COMPLETE   Essential hypertension   Well-controlled.  In fact on the lower end and soft today in the office. He is currently on amlodipine  10 mg once daily. Given his advanced age and relatively low blood pressures we will lower the dose of amlodipine  to 2.5 mg once daily. This can also be discontinued further as needed if blood pressures remain well-controlled.        Relevant Medications   ELIQUIS 2.5 MG TABS tablet   amLODipine  (NORVASC ) 2.5 MG tablet   Other Relevant Orders   EKG 12-Lead (Completed)   Chronic diastolic CHF (congestive heart failure) (HCC) - Primary   Clinical features highly suggestive of diastolic CHF. Recent echocardiogram 07/29/2024 at [facility] noted normal LVEF 60 to 65%, mildly dilated RV with normal function, severe biatrial dilatation, mild to moderate eccentric mitral regurgitation jet, mild aortic insufficiency, mild to moderate TR with estimated RVSP 39 mmHg   This was in the setting of acute UTI/sepsis.  Will reassess cardiac structure and function and valve function with repeat echocardiogram.  He does appear mildly hypervolemic today with bilateral lower extremity dependent pedal edema. Currently not on  any loop diuretic.  Has been on potassium supplementation given hypokalemia while admission to the hospital and being managed by oncology and primary care.  Keep salt intake below 2  g/day. Keep feet elevated and use compression socks if possible.  Will have him return to clinic for follow-up tentatively in 2 months. Further medication optimization based on echocardiogram results electrolytes and his clinical status. Not a candidate for beta-blockers due to slow heart rates. Not a candidate for ACE inhibitor/ARB's, Entresto, SGLT2 inhibitor due to baseline renal function and recent UTI.       Relevant Medications   ELIQUIS 2.5 MG TABS tablet   amLODipine  (NORVASC ) 2.5 MG tablet     Other   Preoperative cardiovascular examination   Being planned for Date/Time: 08/16/2024  Procedure: Procedure(s): AQUEOUS SHUNT TO EXTRAOCULAR EQUATORIAL PLATE RESERVOIR, EXTERNAL APPROACH; WITH GRAFT  [facility]  Surgeon(s): Lanna Glean Agent, MD   Proceed with his surgery as being planned. From cardiac standpoint low risk for perioperative cardiac complications. Okay to hold anticoagulation as needed.       Will follow-up in the office tentatively in 2 months.   History of Present Illness:    Justin Hull is a 89 y.o. male who is being seen today for the evaluation of preop cardiovascular risk assessment at the request of Street, Lonni HERO, MD.  Last visit with cardiology note to review is from 07/19/2018 at [facility] with Dr. Sharran. Follows up with oncologist Dr. Ezzard  Here for visit today accompanied by his son.  Currently living at [facility] rehab center since last week after discharge from [facility].  Since around October he was living at [place] assisted living facility, sustained a fall admitted at [facility] 07/04/2024 underwent left hip hemiarthroplasty 07/05/2024, later discharged to [facility] rehab on 07/09/2024 and from there was discharged back to his assisted living facility.  Sustained another fall and readmitted 07/28/2024 to [facility] diagnosed with metabolic derangements and UTI complicated by  septic shock requiring prolonged inpatient stay, inpatient course also noted to have slow ventricular rate response with underlying atrial fibrillation and was briefly placed on isoproterenol and discharged last week.  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  Being planned for Date/Time: 08/16/2024  Procedure: Procedure(s): AQUEOUS SHUNT TO EXTRAOCULAR EQUATORIAL PLATE RESERVOIR, EXTERNAL APPROACH; WITH GRAFT  [facility]  Surgeon(s): Lanna Glean Agent, MD    Here for the visit today. Mentions has been participating in physical therapy at the rehab.  Uses walker to ambulate with assistance. Denies any cardiac symptoms such as chest pain, shortness of breath, orthopnea or paroxysmal nocturnal dyspnea. Has bilateral pitting pedal edema. Sleeps reclined in his bed.  Denies any orthopnea or paroxysmal nocturnal dyspnea.  EKG in the clinic today shows atrial fibrillation slow ventricular rate response 47 bpm.  QRS 84 ms consistent with LVH morphology.  Transthoracic echocardiogram at [facility] 07/29/2024 noted LVEF 60 to 65%, mildly dilated RV with normal function, severe biatrial dilatation, mild to moderate eccentric mitral regurgitation jet, mild aortic insufficiency, mild to moderate TR with estimated RVSP 39  mmHg   Blood work from [facility] 08/06/2024 noted hemoglobin 9.5, hematocrit 28.4, WBC 4.8 and platelets 125. Sodium 134, potassium 3.9, BUN 12, creatinine 1.1. Normal transaminases and  alkaline phosphatase. Total protein 5.4 and albumin 2.8. Past Medical History:  Diagnosis Date   Atrial fibrillation (HCC) 03/13/2015   Essential hypertension 03/13/2015   Prostate cancer [facility])     Past Surgical History:  Procedure Laterality Date   BUCCAL MASS EXCISION Right 2019   DONE IN [place], [place]   HERNIA REPAIR Left    INGUINAL   PROSTATECTOMY      Current Medications: Active Medications[1]   Allergies:   Patient has no known allergies.   Social History   Socioeconomic History   Marital status: Widowed    Spouse name: Not on file   Number of children: 2   Years of education: 33 + SOME COLLEGE   Highest education level: Not on file  Occupational History   Occupation: RETIRED - [employer]  Tobacco Use   Smoking status: Former   Smokeless tobacco: Former   Tobacco comments:    smokes cigars when Metallurgist Use   Vaping status: Never Used  Substance and Sexual Activity   Alcohol use: Not Currently    Alcohol/week: 1.0 standard drink of alcohol    Types: 1 Cans of beer per week   Drug use: No   Sexual activity: Not Currently  Other Topics Concern   Not on file  Social History Narrative   Not on file   Social Drivers of Health   Tobacco Use: Medium Risk (08/15/2024)   Patient History    Smoking Tobacco Use: Former    Smokeless Tobacco Use: Former    Passive Exposure: Not on Actuary Strain: Not on file  Food Insecurity: No Food Insecurity (05/14/2024)   Epic    Worried About Programme Researcher, Broadcasting/film/video in the Last Year: Never true    Ran Out of Food in the Last Year: Never true  Transportation Needs: No Transportation Needs (05/14/2024)   Epic    Lack of Transportation (Medical): No    Lack of Transportation (Non-Medical): No  Physical Activity: Not on file  Stress: Not on file  Social Connections: Not on file  Depression (PHQ2-9): Low Risk (06/25/2024)   Depression (PHQ2-9)    PHQ-2 Score: 0  Alcohol Screen: Not on file   Housing: Unknown (06/04/2024)   Received from [facility]   Epic    Unable to Pay for Housing in the Last Year: Not on file    Number of Times Moved in the Last Year: Not on file    At any time in the past 12 months, were you homeless or living in a shelter (including now)?: No  Utilities: Not At Risk (05/14/2024)   Epic    Threatened with loss of utilities: No  Health Literacy: Not on file     Family History: The patient's family history includes Dementia in his sister; Diabetes in his brother and sister; Heart attack in his brother and father; Heart disease in his brother and sister; Hypertension in his father; Prostate cancer in his brother. ROS:   Please see the history of present illness.    All 14 point review of systems negative except as described per history of present illness.  EKGs/Labs/Other Studies Reviewed:    The following studies were reviewed today:   EKG:  EKG Interpretation Date/Time:  Wednesday August 15 2024 10:20:03 EST Ventricular Rate:  47 PR Interval:    QRS Duration:  84 QT Interval:  502 QTC Calculation: 444 R Axis:   76  Text Interpretation: Atrial fibrillation with slow ventricular response Minimal voltage criteria for LVH, may be normal variant Cannot rule out Anterior infarct , age undetermined Abnormal ECG No previous ECGs available Confirmed by Liborio Hai reddy [phone]) on 08/15/2024 10:55:04 AM    Recent Labs: 06/22/2024: Hemoglobin 11.7; Platelet Count 134 07/23/2024: ALT 9; BUN 21; Creatinine, Ser 1.66; Potassium 3.0; Sodium 137  Recent Lipid Panel No results found for: CHOL, TRIG, HDL, CHOLHDL, VLDL, LDLCALC, LDLDIRECT  Physical Exam:    VS:  BP (!) 100/40   Pulse (!) 47   Ht 5' 10 (1.778 m)   Wt 143 lb (64.9 kg)   SpO2 97%   BMI 20.52 kg/m    Blood pressure rechecked by me manually right upper 100/40 mmHg.  Wt Readings from Last 3 Encounters:  08/15/24 143 lb (64.9 kg)  06/25/24 139  lb 12.8 oz (63.4 kg)  05/14/24 141 lb (64 kg)     GENERAL:  Well nourished, well developed in no acute distress.  No vision in the left eye.  Right eye vision significantly diminished and mentions he can only make out outlines. NECK: No JVD; No carotid bruits CARDIAC: Irregularly irregular, S1 and S2 present, no murmurs, no rubs, no gallops CHEST:  Clear to auscultation without rales, wheezing or rhonchi  Extremities: 1+ bilateral pitting pedal edema extending slightly above the ankles. Pulses bilaterally symmetric with radial 2+ and dorsalis pedis 2+ NEUROLOGIC:  Alert and oriented x 3  Medication Adjustments/Labs and Tests Ordered: Current medicines are reviewed at length with the patient today.  Concerns regarding medicines are outlined above.  Orders Placed This Encounter  Procedures   EKG 12-Lead   ECHOCARDIOGRAM COMPLETE   Meds ordered this encounter  Medications   amLODipine  (NORVASC ) 2.5 MG tablet    Sig: Take 1 tablet (2.5 mg total) by mouth daily.    Dispense:  90 tablet    Refill:  3    Signed, Bensyn Bornemann reddy Deyon Chizek, MD, MPH, [facility]. 08/15/2024 12:45 PM    [facility]     [1]  Current Meds  Medication Sig   acetaminophen (TYLENOL) 325 MG tablet Take 650 mg by mouth every 6 (six) hours as needed for mild pain (pain score 1-3) or moderate pain (pain score 4-6).   bethanechol (URECHOLINE) 10 MG tablet Take 10 mg by mouth 3 (three) times daily.   calcium-vitamin D (OSCAL WITH D) 500-5 MG-MCG tablet Take 1 tablet by mouth 2 (two) times daily.   collagenase (SANTYL) 250 UNIT/GM ointment Apply 1 Application topically daily.   COMBIGAN 0.2-0.5 % ophthalmic solution instil 1 drop in right eye twice a day   darolutamide  (NUBEQA ) 300 MG tablet Take 1 tablet (300 mg total) by mouth 2 (two) times daily with a meal.   dorzolamide (TRUSOPT) 2 % ophthalmic solution Place 1 drop into the right eye 2 (two) times daily.   ELIQUIS 2.5 MG TABS tablet Take 2.5  mg by mouth 2 (two) times daily.   latanoprost (XALATAN) 0.005 % ophthalmic solution Place 1 drop into the right eye at bedtime.   melatonin 3 MG TABS tablet Take 3 mg by mouth at bedtime.   mirtazapine (REMERON) 7.5 MG tablet Take 7.5 mg by mouth at bedtime.   Multiple Vitamin (MULTI-VITAMIN) tablet Take 1 tablet by mouth daily.  omeprazole (PRILOSEC) 40 MG capsule Take 40 mg by mouth daily.   pantoprazole (PROTONIX) 40 MG tablet Take 40 mg by mouth daily.   pilocarpine (PILOCAR) 1 % ophthalmic solution Place 1 drop into the right eye 3 (three) times daily.   potassium chloride  SA (KLOR-CON  M) 20 MEQ tablet Take 1 tablet (20 mEq total) by mouth 2 (two) times daily.   prednisoLONE acetate (PRED FORTE) 1 % ophthalmic suspension Administer 1 drop into the left eye daily at 0600.   ROCKLATAN 0.02-0.005 % SOLN Place 1 drop into the right eye at bedtime.   senna (SENOKOT) 8.6 MG TABS tablet Take 2 tablets by mouth at bedtime.   sodium bicarbonate 650 MG tablet Take 650 mg by mouth 3 (three) times daily.   tamsulosin (FLOMAX) 0.4 MG CAPS capsule Take 0.4 mg by mouth at bedtime.   [DISCONTINUED] amLODipine  (NORVASC ) 10 MG tablet Take 10 mg by mouth daily.   [DISCONTINUED] LORazepam (ATIVAN) 2 MG tablet Take 1-2 mg by mouth at bedtime as needed.

## 2024-08-15 NOTE — Telephone Encounter (Signed)
 Dr Madireddy: Melene, please have this reviewed with clinical pharmacist. Question given potential reduced effectiveness of Eliquis while taking Xtandi, if rivaroxaban would be a good option. If rivaroxaban is not an option, while comorbidities I would rather have him continue low-dose Eliquis rather than switching to warfarin. Kaitlyn,RPH: Hi Dr. Liborio, xtandi will reduce the effectiveness of both eliquis and xarelto so switching would not be an option. Dr MadireddyIn that case given the complexity and overall frailty, hold off on warfarin and continue with low-dose Eliquis, with the understanding that the dose may not be as effective. Thank you  Kaitlyn S,RPH: Thank you very much Dr. Liborio!

## 2024-08-15 NOTE — Assessment & Plan Note (Addendum)
 Being planned for Date/Time: 08/16/2024  Procedure: Procedure(s): AQUEOUS SHUNT TO EXTRAOCULAR EQUATORIAL PLATE RESERVOIR, EXTERNAL APPROACH; WITH GRAFT  [facility]  Surgeon(s): Lanna Glean Agent, MD   Proceed with his surgery as being planned. From cardiac standpoint low risk for perioperative cardiac complications. Okay to hold anticoagulation as needed.

## 2024-08-16 ENCOUNTER — Telehealth: Payer: Self-pay

## 2024-08-16 ENCOUNTER — Other Ambulatory Visit: Payer: Self-pay

## 2024-08-17 ENCOUNTER — Other Ambulatory Visit: Payer: Self-pay

## 2024-08-17 ENCOUNTER — Other Ambulatory Visit: Payer: Self-pay | Admitting: Oncology

## 2024-08-17 MED ORDER — ENZALUTAMIDE 80 MG PO TABS
160.0000 mg | ORAL_TABLET | Freq: Every day | ORAL | 5 refills | Status: DC
  Filled 2024-08-20: qty 60, 30d supply, fill #0

## 2024-08-17 NOTE — Progress Notes (Signed)
 D/C Nubeqa - pt will be starting new therapy

## 2024-08-17 NOTE — Telephone Encounter (Signed)
 Oral Chemotherapy Pharmacist Encounter  I spoke with patients son, Justin Hull, for overview of: Xtandi  for the treatment of metastatic prostate cancer in conjunction with Xgeva  and Firmagon, planned duration until disease progression or unacceptable toxicity.   Treatment goal: Control  Counseled patient on administration, dosing, side effects, monitoring, drug-food interactions, safe handling, storage, and disposal.  Patient will take Xtandi  80mg  tablets, 2 tablets (160mg  total) by mouth once daily without regard to food.  Xtandi  start date: 08/22/2024  Adverse effects include but are not limited to: peripheral edema, GI upset, hypertension, hot flashes, fatigue, falls/fractures, and arthralgias.   Patient instructed about small risk of seizures with Xtandi  treatment. They are aware of the the interaction with xtandi  and so is the patients cardiologist as noted by [name] in epic note.   Reviewed with patient importance of keeping a medication schedule and plan for any missed doses. No barriers to medication adherence identified.  Medication reconciliation performed and medication/allergy list updated.  Distress thermometer not completed during telephone call as patient has been on previous lines of therapy.   Communication and Learning Assessment Primary learner: Justin Hull Barriers to learning: No barriers Preferred language: English Learning preferences: Listening Reading  All questions answered. Patient and patients son voiced understanding and appreciation. Medication education handout placed in mail for patient. Patient knows to call the office with questions or concerns. Oral Chemotherapy Clinic phone number provided to patient.   Brynley Cuddeback, PharmD Hematology/Oncology Clinical Pharmacist [facility] Oral Chemotherapy Navigation Clinic [phone] 08/17/2024   11:38 AM

## 2024-08-20 ENCOUNTER — Other Ambulatory Visit: Payer: Self-pay

## 2024-08-20 ENCOUNTER — Inpatient Hospital Stay: Attending: Oncology

## 2024-08-20 ENCOUNTER — Inpatient Hospital Stay

## 2024-08-20 ENCOUNTER — Other Ambulatory Visit: Payer: Self-pay | Admitting: Oncology

## 2024-08-20 ENCOUNTER — Telehealth: Payer: Self-pay

## 2024-08-20 DIAGNOSIS — C7951 Secondary malignant neoplasm of bone: Secondary | ICD-10-CM

## 2024-08-20 DIAGNOSIS — Z79899 Other long term (current) drug therapy: Secondary | ICD-10-CM | POA: Insufficient documentation

## 2024-08-20 DIAGNOSIS — C61 Malignant neoplasm of prostate: Secondary | ICD-10-CM | POA: Diagnosis present

## 2024-08-20 LAB — CBC WITH DIFFERENTIAL (CANCER CENTER ONLY)
Abs Immature Granulocytes: 0.01 K/uL (ref 0.00–0.07)
Basophils Absolute: 0 K/uL (ref 0.0–0.1)
Basophils Relative: 1 %
Eosinophils Absolute: 0.1 K/uL (ref 0.0–0.5)
Eosinophils Relative: 2 %
HCT: 29 % — ABNORMAL LOW (ref 39.0–52.0)
Hemoglobin: 9.1 g/dL — ABNORMAL LOW (ref 13.0–17.0)
Immature Granulocytes: 0 %
Lymphocytes Relative: 26 %
Lymphs Abs: 1.1 K/uL (ref 0.7–4.0)
MCH: 30.5 pg (ref 26.0–34.0)
MCHC: 31.4 g/dL (ref 30.0–36.0)
MCV: 97.3 fL (ref 80.0–100.0)
Monocytes Absolute: 0.4 K/uL (ref 0.1–1.0)
Monocytes Relative: 8 %
Neutro Abs: 2.7 K/uL (ref 1.7–7.7)
Neutrophils Relative %: 63 %
Platelet Count: 233 K/uL (ref 150–400)
RBC: 2.98 MIL/uL — ABNORMAL LOW (ref 4.22–5.81)
RDW: 15.7 % — ABNORMAL HIGH (ref 11.5–15.5)
WBC Count: 4.2 K/uL (ref 4.0–10.5)
nRBC: 0 % (ref 0.0–0.2)

## 2024-08-20 LAB — CMP (CANCER CENTER ONLY)
ALT: 9 U/L (ref 0–44)
AST: 22 U/L (ref 15–41)
Albumin: 3.5 g/dL (ref 3.5–5.0)
Alkaline Phosphatase: 91 U/L (ref 38–126)
Anion gap: 9 (ref 5–15)
BUN: 25 mg/dL — ABNORMAL HIGH (ref 8–23)
CO2: 33 mmol/L — ABNORMAL HIGH (ref 22–32)
Calcium: 8.5 mg/dL — ABNORMAL LOW (ref 8.9–10.3)
Chloride: 105 mmol/L (ref 98–111)
Creatinine: 1.49 mg/dL — ABNORMAL HIGH (ref 0.61–1.24)
GFR, Estimated: 44 mL/min — ABNORMAL LOW
Glucose, Bld: 90 mg/dL (ref 70–99)
Potassium: 2.5 mmol/L — CL (ref 3.5–5.1)
Sodium: 147 mmol/L — ABNORMAL HIGH (ref 135–145)
Total Bilirubin: 0.4 mg/dL (ref 0.0–1.2)
Total Protein: 5.5 g/dL — ABNORMAL LOW (ref 6.5–8.1)

## 2024-08-20 NOTE — Progress Notes (Signed)
 Specialty Pharmacy Initial Fill Coordination Note  Justin Hull is a 89 y.o. male contacted today regarding refills of specialty medication(s) Enzalutamide  (XTANDI ) .  Patient requested Delivery  on 08/21/24  to verified address [address] [place], [place] [address]   Medication will be filled on 08/20/24  Patient is aware of 0.00 copayment.

## 2024-08-20 NOTE — Telephone Encounter (Signed)
 I spoke with Justin Hull to confirm he is resident @ [facility]. He has the Xtandi  order 80mg  2 tabs po qd. Pt wills tar the Xtandi  in the am. Kaitlyn Schomburg,RPH and Tammy Beal,CMA notified.

## 2024-08-20 NOTE — Progress Notes (Signed)
 CRITICAL VALUE STICKER  CRITICAL VALUE:  K+ 2.5  RECEIVER (on-site recipient of call):  Woodie Kapur RN  DATE & TIME NOTIFIED:   08/20/2024 @ 1424  MESSENGER (representative from lab):  [facility]  MD NOTIFIED:   Dr. Ezzard  TIME OF NOTIFICATION:  1434  RESPONSE:   Acknowledged

## 2024-08-20 NOTE — Progress Notes (Signed)
 Patients son counseled on xtandi  in telephone encounter opened on 08/16/2024.  Keylah Darwish, PharmD Hematology/Oncology Clinical Pharmacist [facility] [phone]

## 2024-08-20 NOTE — Progress Notes (Signed)
 Justin Hull presented today for lab work/ Xgeva  injection. Patients Calcium was 8.5 today. Per Dr. Ezzard Hold Xgeva  at this time. Potasium level was also low. Patient reports having multiple episodes of Diarrhea daily. Patient stated that he did not have the Diarrhea when he D/C the Xtandi  during a resent Surgery. Per Dr. Ezzard we will hold [name]  for one week to see if this resolves. All questions were asked. Patients son received a copy of lab work.

## 2024-08-21 LAB — PSA, TOTAL AND FREE
PSA, Free Pct: 7.5 %
PSA, Free: 0.58 ng/mL
Prostate Specific Ag, Serum: 7.7 ng/mL — ABNORMAL HIGH (ref 0.0–4.0)

## 2024-08-24 ENCOUNTER — Other Ambulatory Visit: Payer: Self-pay

## 2024-08-28 ENCOUNTER — Ambulatory Visit

## 2024-08-31 ENCOUNTER — Telehealth: Payer: Self-pay

## 2024-08-31 NOTE — Progress Notes (Signed)
 NVG OD due to CRVO - with pupillary NV - no FHxG/trauma/steroids CCT= 480/592  S/p IVx Avastin inj OD 06/04/24  POW#2 s/p Ahmed OD 08/16/24 - doing well  VA 1/26 OD: CF @ face OS: NLP  IOP= 14/3 using combigan bid OS - holding gtts OD  - was using combigan 2/2, dorzolamide 2/0, pilocarpine 3/0, and Rocklatan 1/0   SLE 1/26 OD: K clear, iris intact, pupillary NVi, IOL stable OS: surgical pupil, IOL, fibrotic/PVR anterior vitreous  gonio 10/25 OD: SS 360, 1+ TM pigment OS: NAS seen   OCT 10/25 OD: very thick/uniform RNFL; large area of central CME OS: ND - no priors for comparison  ONs 10/25 OD: 0.2, diffuse IR heme OS: no view  Impression/Plan: NVG OD due to CRVO - with pupillary NV - pt states vision has decreased OD over past 6 weeks - marked CME OD Seen by Dr. Barba with avastin inj OD 06/04/24 - has f/u appt 07/02/24  POW#1 s/p Ahmed OD 08/16/24 - IOP stable  Plan:  1) Start combigan bid OD  (now OU)   - d/c PF qid/Ocuflox OD 2) RTC 1 month post-op IOP

## 2024-08-31 NOTE — Telephone Encounter (Signed)
 Received call from pt's son. He states his dad is on Cipro for a UTI. He wanted to know if he should finish the Cipro before starting Xtandi ? I sent message to Kaitlyn Schomburg,RPH and Kelli Mosher,PA-C. They both agree better to wait until Cipro done. I did call Tyler,RN, unit manager @ [facility] to make him aware. He verbalized understanding.

## 2024-08-31 NOTE — Progress Notes (Signed)
 Justin Hull is a 89 y.o. male patient referred by Lanna Glean Agent, MD [address] [place],  [place] [address]   Assessment/Plan:  CRVO OD Monocular Patient -OCT with resolution of ME after single IVA injection on 06/04/24 -However, vision remained poor, possibly due to glaucomatous damage  Today: VA subjective slightly improved, though still CF IOP good  Injections: IVA OD: 06/04/24, 07/02/24, 09/03/24  NVG OD s/p Ahmed 08/16/24 Justin Hull) -F/b Dr Lanna -IOP good today -Continue drops and pills per Dr. Lanna  -RTC 3 month OCT with possible IVE OD  RTC: 3 month(s) for [x]  Exam [x]  OCT macula  []  B-scan ultrasound  []  Fluorescein  angiogram []  Fundus Photos [x]  Possible intravitreal injection -  Transit [x]  OD  []  OS [x]  Eylea []  Avastin []  Vabysmo []  Ozurdex []  Triescence []  Intravitreal injection ONLY - no exam or imaging  Selinda Crochet, MD-PhD Vitreoretinal Surgery [facility]  ----------------------------------------------------------- Pertinent findings discussed with patient and questions answered. The Chief Complaint, HPI, ROS, and PFSHx as documented were reviewed with the patient and updated as appropriate. I personally evaluated this patient and corroborated findings as documented in the resident/fellow/technician entries. I am in agreement with the management plan as outlined above and counseled the patient/parents on the therapeutic recommendations detailed above.

## 2024-09-06 ENCOUNTER — Inpatient Hospital Stay (HOSPITAL_COMMUNITY)
Admission: EM | Admit: 2024-09-06 | Discharge: 2024-09-08 | DRG: 871 | Disposition: A | Source: Skilled Nursing Facility | Attending: Internal Medicine | Admitting: Internal Medicine

## 2024-09-06 ENCOUNTER — Emergency Department (HOSPITAL_COMMUNITY)

## 2024-09-06 ENCOUNTER — Other Ambulatory Visit: Payer: Self-pay

## 2024-09-06 DIAGNOSIS — Z1152 Encounter for screening for COVID-19: Secondary | ICD-10-CM

## 2024-09-06 DIAGNOSIS — C61 Malignant neoplasm of prostate: Secondary | ICD-10-CM | POA: Diagnosis present

## 2024-09-06 DIAGNOSIS — Z66 Do not resuscitate: Secondary | ICD-10-CM | POA: Diagnosis present

## 2024-09-06 DIAGNOSIS — T68XXXA Hypothermia, initial encounter: Secondary | ICD-10-CM

## 2024-09-06 DIAGNOSIS — G9341 Metabolic encephalopathy: Secondary | ICD-10-CM | POA: Diagnosis present

## 2024-09-06 DIAGNOSIS — L89159 Pressure ulcer of sacral region, unspecified stage: Secondary | ICD-10-CM | POA: Diagnosis present

## 2024-09-06 DIAGNOSIS — L89623 Pressure ulcer of left heel, stage 3: Secondary | ICD-10-CM | POA: Diagnosis present

## 2024-09-06 DIAGNOSIS — Z79899 Other long term (current) drug therapy: Secondary | ICD-10-CM

## 2024-09-06 DIAGNOSIS — D6489 Other specified anemias: Secondary | ICD-10-CM | POA: Diagnosis present

## 2024-09-06 DIAGNOSIS — E039 Hypothyroidism, unspecified: Secondary | ICD-10-CM | POA: Diagnosis present

## 2024-09-06 DIAGNOSIS — Z781 Physical restraint status: Secondary | ICD-10-CM

## 2024-09-06 DIAGNOSIS — I482 Chronic atrial fibrillation, unspecified: Secondary | ICD-10-CM | POA: Diagnosis present

## 2024-09-06 DIAGNOSIS — R001 Bradycardia, unspecified: Secondary | ICD-10-CM | POA: Diagnosis present

## 2024-09-06 DIAGNOSIS — R410 Disorientation, unspecified: Principal | ICD-10-CM

## 2024-09-06 DIAGNOSIS — Z87891 Personal history of nicotine dependence: Secondary | ICD-10-CM

## 2024-09-06 DIAGNOSIS — I129 Hypertensive chronic kidney disease with stage 1 through stage 4 chronic kidney disease, or unspecified chronic kidney disease: Secondary | ICD-10-CM | POA: Diagnosis present

## 2024-09-06 DIAGNOSIS — Z8249 Family history of ischemic heart disease and other diseases of the circulatory system: Secondary | ICD-10-CM

## 2024-09-06 DIAGNOSIS — N400 Enlarged prostate without lower urinary tract symptoms: Secondary | ICD-10-CM | POA: Diagnosis present

## 2024-09-06 DIAGNOSIS — Z7969 Long term (current) use of other immunomodulators and immunosuppressants: Secondary | ICD-10-CM

## 2024-09-06 DIAGNOSIS — I4891 Unspecified atrial fibrillation: Secondary | ICD-10-CM

## 2024-09-06 DIAGNOSIS — Z833 Family history of diabetes mellitus: Secondary | ICD-10-CM

## 2024-09-06 DIAGNOSIS — N1831 Chronic kidney disease, stage 3a: Secondary | ICD-10-CM | POA: Diagnosis present

## 2024-09-06 DIAGNOSIS — C7951 Secondary malignant neoplasm of bone: Secondary | ICD-10-CM | POA: Diagnosis present

## 2024-09-06 DIAGNOSIS — C781 Secondary malignant neoplasm of mediastinum: Secondary | ICD-10-CM | POA: Diagnosis present

## 2024-09-06 DIAGNOSIS — D631 Anemia in chronic kidney disease: Secondary | ICD-10-CM | POA: Diagnosis present

## 2024-09-06 DIAGNOSIS — Z7901 Long term (current) use of anticoagulants: Secondary | ICD-10-CM

## 2024-09-06 DIAGNOSIS — C779 Secondary and unspecified malignant neoplasm of lymph node, unspecified: Secondary | ICD-10-CM | POA: Diagnosis present

## 2024-09-06 DIAGNOSIS — E875 Hyperkalemia: Secondary | ICD-10-CM | POA: Diagnosis present

## 2024-09-06 DIAGNOSIS — C786 Secondary malignant neoplasm of retroperitoneum and peritoneum: Secondary | ICD-10-CM | POA: Diagnosis present

## 2024-09-06 DIAGNOSIS — R1312 Dysphagia, oropharyngeal phase: Secondary | ICD-10-CM | POA: Diagnosis present

## 2024-09-06 DIAGNOSIS — J189 Pneumonia, unspecified organism: Secondary | ICD-10-CM | POA: Diagnosis present

## 2024-09-06 DIAGNOSIS — D6959 Other secondary thrombocytopenia: Secondary | ICD-10-CM | POA: Diagnosis present

## 2024-09-06 DIAGNOSIS — Z87440 Personal history of urinary (tract) infections: Secondary | ICD-10-CM

## 2024-09-06 DIAGNOSIS — Z515 Encounter for palliative care: Secondary | ICD-10-CM

## 2024-09-06 DIAGNOSIS — A419 Sepsis, unspecified organism: Principal | ICD-10-CM | POA: Diagnosis present

## 2024-09-06 DIAGNOSIS — C7801 Secondary malignant neoplasm of right lung: Secondary | ICD-10-CM | POA: Diagnosis present

## 2024-09-06 LAB — I-STAT VENOUS BLOOD GAS, ED
Acid-base deficit: 5 mmol/L — ABNORMAL HIGH (ref 0.0–2.0)
Bicarbonate: 20.6 mmol/L (ref 20.0–28.0)
Calcium, Ion: 1.35 mmol/L (ref 1.15–1.40)
HCT: 30 % — ABNORMAL LOW (ref 39.0–52.0)
Hemoglobin: 10.2 g/dL — ABNORMAL LOW (ref 13.0–17.0)
O2 Saturation: 74 %
Potassium: 5.9 mmol/L — ABNORMAL HIGH (ref 3.5–5.1)
Sodium: 141 mmol/L (ref 135–145)
TCO2: 22 mmol/L (ref 22–32)
pCO2, Ven: 40.2 mmHg — ABNORMAL LOW (ref 44–60)
pH, Ven: 7.316 (ref 7.25–7.43)
pO2, Ven: 43 mmHg (ref 32–45)

## 2024-09-06 LAB — I-STAT CHEM 8, ED
BUN: 20 mg/dL (ref 8–23)
Calcium, Ion: 1.35 mmol/L (ref 1.15–1.40)
Chloride: 110 mmol/L (ref 98–111)
Creatinine, Ser: 1.4 mg/dL — ABNORMAL HIGH (ref 0.61–1.24)
Glucose, Bld: 126 mg/dL — ABNORMAL HIGH (ref 70–99)
HCT: 30 % — ABNORMAL LOW (ref 39.0–52.0)
Hemoglobin: 10.2 g/dL — ABNORMAL LOW (ref 13.0–17.0)
Potassium: 5.9 mmol/L — ABNORMAL HIGH (ref 3.5–5.1)
Sodium: 141 mmol/L (ref 135–145)
TCO2: 20 mmol/L — ABNORMAL LOW (ref 22–32)

## 2024-09-06 LAB — I-STAT CG4 LACTIC ACID, ED: Lactic Acid, Venous: 1.9 mmol/L (ref 0.5–1.9)

## 2024-09-06 LAB — CBG MONITORING, ED: Glucose-Capillary: 110 mg/dL — ABNORMAL HIGH (ref 70–99)

## 2024-09-06 MED ORDER — METRONIDAZOLE 500 MG/100ML IV SOLN
500.0000 mg | Freq: Once | INTRAVENOUS | Status: AC
  Administered 2024-09-07: 500 mg via INTRAVENOUS
  Filled 2024-09-06: qty 100

## 2024-09-06 MED ORDER — LACTATED RINGERS IV BOLUS (SEPSIS)
1000.0000 mL | Freq: Once | INTRAVENOUS | Status: AC
  Administered 2024-09-07: 1000 mL via INTRAVENOUS

## 2024-09-06 MED ORDER — LACTATED RINGERS IV SOLN: INTRAVENOUS | Status: DC

## 2024-09-06 MED ORDER — VANCOMYCIN HCL IN DEXTROSE 1-5 GM/200ML-% IV SOLN
1000.0000 mg | Freq: Once | INTRAVENOUS | Status: AC
  Administered 2024-09-07: 1000 mg via INTRAVENOUS
  Filled 2024-09-06: qty 200

## 2024-09-06 MED ORDER — LACTATED RINGERS IV BOLUS (SEPSIS): 1000.0000 mL | Freq: Once | INTRAVENOUS | Status: DC

## 2024-09-06 MED ORDER — SODIUM CHLORIDE 0.9 % IV SOLN
2.0000 g | Freq: Once | INTRAVENOUS | Status: AC
  Administered 2024-09-07: 2 g via INTRAVENOUS
  Filled 2024-09-06: qty 12.5

## 2024-09-06 NOTE — Progress Notes (Signed)
 Elink monitoring for the code sepsis protocol.

## 2024-09-06 NOTE — ED Triage Notes (Signed)
 Pt BIB [facility] from [facility]. Staff reports pt is more confused than normal and pts potassium was 6.7; they attempted to give Lokelma  but was unable to get pt to drink it. In route EMS administered sodium Bicarb and calcium  gluconate. Pt also combative with EMS, 2.5 mg Haldol  given x2. On arrival to [facility] pt still combatative, denies pain.

## 2024-09-06 NOTE — ED Provider Notes (Signed)
 " [place] [facility] Provider Note   CSN: [id number] Arrival date & time: 09/06/24  2244     Patient presents with: Altered Mental Status  Level 5 caveat due to altered mental status Justin Hull is a 89 y.o. male.   The history is provided by the patient.  Patient with hypertension, prostate cancer, atrial fibrillation on anticoagulation presents with altered mental status. It is reported patient has had increasing altered mental status while the nursing home.  He was found to have an elevated potassium and was given Lokelma .  EMS gave bicarb and calcium  gluconate.  Patient was also given Haldol  per EMS for his agitation No other details known on arrival   Past Medical History:  Diagnosis Date   Atrial fibrillation (HCC) 03/13/2015   Essential hypertension 03/13/2015   Prostate cancer [facility])     Prior to Admission medications  Medication Sig Start Date End Date Taking? Authorizing Provider  acetaminophen  (TYLENOL ) 325 MG tablet Take 650 mg by mouth every 6 (six) hours as needed for mild pain (pain score 1-3) or moderate pain (pain score 4-6).    [provider]  amLODipine  (NORVASC ) 2.5 MG tablet Take 1 tablet (2.5 mg total) by mouth daily. 08/15/24   Madireddy, Alean SAUNDERS, MD  bethanechol (URECHOLINE) 10 MG tablet Take 10 mg by mouth 3 (three) times daily. 07/27/24   [provider]  calcium -vitamin D (OSCAL WITH D) 500-5 MG-MCG tablet Take 1 tablet by mouth 2 (two) times daily.    [provider]  ciprofloxacin (CIPRO) 500 MG tablet Take 500 mg by mouth 2 (two) times daily. 08/29/24   [provider]  collagenase (SANTYL) 250 UNIT/GM ointment Apply 1 Application topically daily.    [provider]  COMBIGAN 0.2-0.5 % ophthalmic solution instil 1 drop in right eye twice a day 01/18/17   [provider]  dorzolamide (TRUSOPT) 2 % ophthalmic solution Place 1 drop into the right eye 2 (two) times  daily. 05/29/24   [provider]  ELIQUIS 2.5 MG TABS tablet Take 2.5 mg by mouth 2 (two) times daily. 07/27/24   [provider]  enzalutamide  (XTANDI ) 80 MG tablet Take 2 tablets (160 mg total) by mouth daily. 08/17/24   Lewis, Dequincy A, MD  erythromycin ophthalmic ointment Place 1 Application into the right eye 3 (three) times daily. 08/16/24   [provider]  latanoprost (XALATAN) 0.005 % ophthalmic solution Place 1 drop into the right eye at bedtime.    [provider]  LORazepam  (ATIVAN ) 0.5 MG tablet Take 0.5 mg by mouth 2 (two) times daily. 08/25/24   [provider]  melatonin 3 MG TABS tablet Take 3 mg by mouth at bedtime.    [provider]  mirtazapine  (REMERON ) 7.5 MG tablet Take 7.5 mg by mouth at bedtime.    [provider]  Multiple Vitamin (MULTI-VITAMIN) tablet Take 1 tablet by mouth daily.    [provider]  omeprazole (PRILOSEC) 40 MG capsule Take 40 mg by mouth daily. 05/08/24   [provider]  pantoprazole (PROTONIX) 40 MG tablet Take 40 mg by mouth daily.    [provider]  pilocarpine (PILOCAR) 1 % ophthalmic solution Place 1 drop into the right eye 3 (three) times daily. 07/31/24   [provider]  potassium chloride  SA (KLOR-CON  M) 20 MEQ tablet Take 1 tablet (20 mEq total) by mouth 2 (two) times daily. 07/23/24   Mosher, Kelli  A, PA-C  prednisoLONE acetate (PRED FORTE) 1 % ophthalmic suspension Administer 1 drop into the left eye daily at 0600. 11/06/16   [provider]  ROCKLATAN 0.02-0.005 % SOLN Place 1 drop into the right eye at bedtime. 05/29/24   [provider]  senna (SENOKOT) 8.6 MG TABS tablet Take 2 tablets by mouth at bedtime. 05/31/24   [provider]  sodium bicarbonate  650 MG tablet Take 650 mg by mouth 3 (three) times daily.    [provider]  tamsulosin  (FLOMAX ) 0.4 MG CAPS capsule Take 0.4 mg by mouth at bedtime.     [provider]    Allergies: Patient has no known allergies.    Review of Systems  Unable to perform ROS: Mental status change    Updated Vital Signs BP (!) 114/55   Pulse (!) 55   Temp (!) 91.3 F (32.9 C)   Resp (!) 23   Ht 1.778 m (5' 10)   Wt 64 kg   SpO2 97%   BMI 20.24 kg/m   Physical Exam CONSTITUTIONAL: Elderly mildly agitated HEAD: Normocephalic/atraumatic, no visible trauma EYES: Left eye surgically correct ENMT: Mucous membranes moist NECK: supple no meningeal signs SPINE/BACK:entire spine nontender CV: Regular, bradycardic LUNGS: Lungs are clear to auscultation bilaterally, no apparent distress ABDOMEN: soft, nontender GU: Patient wearing a diaper NEURO: Pt is awake and alert He is agitated and confused EXTREMITIES: No deformities, pulses normal in all extremities, edema noted SKIN: Skin is cool to touch PSYCH: Agitated (all labs ordered are listed, but only abnormal results are displayed) Labs Reviewed  COMPREHENSIVE METABOLIC PANEL WITH GFR - Abnormal; Notable for the following components:      Result Value   Potassium 6.0 (*)    Glucose, Bld 132 (*)    Creatinine, Ser 1.34 (*)    Total Protein 5.8 (*)    GFR, Estimated 50 (*)    All other components within normal limits  CBC WITH DIFFERENTIAL/PLATELET - Abnormal; Notable for the following components:   RBC 3.06 (*)    Hemoglobin 9.4 (*)    HCT 28.8 (*)    RDW 16.5 (*)    Platelets 143 (*)    All other components within normal limits  URINALYSIS, ROUTINE W REFLEX MICROSCOPIC - Abnormal; Notable for the following components:   Hgb urine dipstick MODERATE (*)    All other components within normal limits  TSH - Abnormal; Notable for the following components:   TSH 10.500 (*)    All other components within normal limits  PROTIME-INR - Abnormal; Notable for the following components:   Prothrombin Time 16.7 (*)    INR 1.3 (*)    All other components within normal limits  PRO BRAIN  NATRIURETIC PEPTIDE - Abnormal; Notable for the following components:   Pro Brain Natriuretic Peptide 611.0 (*)    All other components within normal limits  URINALYSIS, MICROSCOPIC (REFLEX) - Abnormal; Notable for the following components:   Bacteria, UA RARE (*)    All other components within normal limits  CBG MONITORING, ED - Abnormal; Notable for the following components:   Glucose-Capillary 110 (*)    All other components within normal limits  I-STAT CHEM 8, ED - Abnormal; Notable for the following components:   Potassium 5.9 (*)    Creatinine, Ser 1.40 (*)    Glucose, Bld 126 (*)    TCO2 20 (*)    Hemoglobin 10.2 (*)    HCT 30.0 (*)    All  other components within normal limits  I-STAT VENOUS BLOOD GAS, ED - Abnormal; Notable for the following components:   pCO2, Ven 40.2 (*)    Acid-base deficit 5.0 (*)    Potassium 5.9 (*)    HCT 30.0 (*)    Hemoglobin 10.2 (*)    All other components within normal limits  TROPONIN T, HIGH SENSITIVITY - Abnormal; Notable for the following components:   Troponin T High Sensitivity 42 (*)    All other components within normal limits  RESP PANEL BY RT-PCR (RSV, FLU A&B, COVID)  RVPGX2  CULTURE, BLOOD (ROUTINE X 2)  CULTURE, BLOOD (ROUTINE X 2)  URINE CULTURE  RESPIRATORY PANEL BY PCR  T4, FREE  T3, FREE  CORTISOL  AMMONIA  URINE DRUG SCREEN  LEGIONELLA PNEUMOPHILA SEROGP 1 UR AG  STREP PNEUMONIAE URINARY ANTIGEN  I-STAT CG4 LACTIC ACID, ED  CBG MONITORING, ED  TROPONIN T, HIGH SENSITIVITY    EKG: EKG Interpretation Date/Time:  Thursday September 06 2024 23:34:21 EST Ventricular Rate:  38 PR Interval:    QRS Duration:  79 QT Interval:  571 QTC Calculation: 454 R Axis:   68  Text Interpretation: Atrial fibrillation with slow ventricular response Abnormal ekg Confirmed by Midge Golas ([id number]) on 09/06/2024 11:43:38 PM  Radiology: CT HEAD WO CONTRAST Result Date: 09/07/2024 EXAM: CT HEAD WITHOUT CONTRAST 09/07/2024 12:56:13  AM TECHNIQUE: CT of the head was performed without the administration of intravenous contrast. Automated exposure control, iterative reconstruction, and/or weight based adjustment of the mA/kV was utilized to reduce the radiation dose to as low as reasonably achievable. COMPARISON: 07/28/2024 CLINICAL HISTORY: Delirium. FINDINGS: BRAIN AND VENTRICLES: No acute hemorrhage. No evidence of acute infarct. No hydrocephalus. No extra-axial collection. No mass effect or midline shift. Atrophy and chronic small vessel disease throughout the deep white matter. ORBITS: No acute abnormality. SINUSES: No acute abnormality. SOFT TISSUES AND SKULL: No acute soft tissue abnormality. No skull fracture. IMPRESSION: 1. No acute intracranial abnormality. 2. Atrophy and chronic small vessel ischemic disease in the deep white matter. Electronically signed by: Franky Crease MD MD 09/07/2024 12:59 AM EST RP Workstation: [id number]   DG Chest Port 1 View Result Date: 09/06/2024 EXAM: 1 VIEW XRAY OF THE CHEST 09/06/2024 11:36:24 PM COMPARISON: 07/28/2024 CLINICAL HISTORY: weakness/altered FINDINGS: LUNGS AND PLEURA: Bilateral lower lobe airspace opacities could reflect edema or infection. No pleural effusion. No pneumothorax. HEART AND MEDIASTINUM: Aortic atherosclerosis. No acute abnormality of the cardiac and mediastinal silhouettes. BONES AND SOFT TISSUES: No acute osseous abnormality. IMPRESSION: 1. Bilateral lower lobe airspace opacities, possibly representing edema or infection. Electronically signed by: Franky Crease MD MD 09/06/2024 11:40 PM EST RP Workstation: [id number]     .Critical Care  Performed by: Midge Golas, MD Authorized by: Midge Golas, MD   Critical care provider statement:    Critical care time (minutes):  120   Critical care start time:  09/06/2024 11:40 PM   Critical care end time:  09/07/2024 1:40 AM   Critical care time was exclusive of:  Separately billable procedures and treating other patients    Critical care was necessary to treat or prevent imminent or life-threatening deterioration of the following conditions:  Sepsis, shock and CNS failure or compromise   Critical care was time spent personally by me on the following activities:  Obtaining history from patient or surrogate, examination of patient, evaluation of patient's response to treatment, discussions with consultants, pulse oximetry, ordering and review of laboratory studies, ordering and review of radiographic studies,  ordering and performing treatments and interventions, re-evaluation of patient's condition, review of old charts and development of treatment plan with patient or surrogate   I assumed direction of critical care for this patient from another provider in my specialty: no     Care discussed with: admitting provider      Medications Ordered in the ED  vancomycin  (VANCOCIN ) IVPB 1000 mg/200 mL premix (1,000 mg Intravenous New Bag/Given 09/07/24 0233)  LORazepam  (ATIVAN ) injection 0.5 mg (0.5 mg Intravenous Not Given 09/07/24 0043)  atropine  1 MG/10ML injection 1 mg (has no administration in time range)  calcium  gluconate 1 g/ 50 mL sodium chloride  IVPB (1,000 mg Intravenous New Bag/Given 09/07/24 0253)  lactated ringers  bolus 1,000 mL (0 mLs Intravenous Stopped 09/07/24 0042)  ceFEPIme  (MAXIPIME ) 2 g in sodium chloride  0.9 % 100 mL IVPB (0 g Intravenous Stopped 09/07/24 0041)  metroNIDAZOLE  (FLAGYL ) IVPB 500 mg (0 mg Intravenous Stopped 09/07/24 0227)  insulin  aspart (novoLOG ) injection 5 Units (5 Units Intravenous Given 09/07/24 0227)    And  dextrose  50 % solution 50 mL (50 mLs Intravenous Given 09/07/24 0228)    Clinical Course as of 09/07/24 0308  Thu Sep 06, 2024  2340 Discussed the case with nurse from the [facility] nursing home Patient is had increasing altered mental status for the past several days.  He has been hallucinating and also increasingly agitated.  This is not his baseline.  He recently had injectionsand it  seemed to worsen after that.  They noted at the facility that he had hyperkalemia and they attempted to give Lokelma .  He also had an elevated TSH which is unusual for him. He had a negative flu test [DW]  Fri Sep 07, 2024  0018 Patient found to be hypothermic, Bair hugger applied. He has intermittent bradycardia though he has had A-fib with slow ventricular response previously Blood pressure is appropriate. His sons Medford and Chyrl are at bedside.  They report he has had difficulty regulating his temperature on previous admissions However we will continue to search for cause. [DW]  9767 Due to his worsening condition, I have consulted Dr. Kara with ICU Also ordered calcium  for his bradycardia in the setting of hyperkalemia [DW]  0256 Patient is been stabilized in the ER. Temperature is now improving with Bair hugger Due to his hyperkalemia and bradycardia, patient has been given calcium  and insulin  and glucose He is also been given IV fluids and antibiotics if there is any underlying sepsis [DW]  0307 Due to multiple abnormal findings, would entertain underlying endocrine or thyroid  emergency  [DW]    Clinical Course User Index [DW] Midge Golas, MD                                 Medical Decision Making Amount and/or Complexity of Data Reviewed Labs: ordered. Radiology: ordered.  Risk OTC drugs. Prescription drug management. Decision regarding hospitalization.   This patient presents to the ED for concern of altered mental status, this involves an extensive number of treatment options, and is a complaint that carries with it a high risk of complications and morbidity.  The differential diagnosis includes but is not limited to CVA, intracranial hemorrhage, acute coronary syndrome, renal failure, urinary tract infection, electrolyte disturbance, pneumonia, thyroid  emergency   Comorbidities that complicate the patient evaluation: Patients presentation is complicated by their  history of atrial fibrillation  Social Determinants of Health: Patients nursing home status  increases the complexity of managing their presentation  Additional history obtained: Additional history obtained from nursing home/care facility Records reviewed previous admission documents and Care Everywhere/External Records  Lab Tests: I Ordered, and personally interpreted labs.  The pertinent results include: Hyperkalemia, renal insufficiency  Imaging Studies ordered: I ordered imaging studies including CT scan head and X-ray chest  I independently visualized and interpreted imaging which showed CT head negative, x-ray shows edema versus infiltrate I agree with the radiologist interpretation  Cardiac Monitoring: The patient was maintained on a cardiac monitor.  I personally viewed and interpreted the cardiac monitor which showed an underlying rhythm of:  Atrial Fibrillation  Medicines ordered and prescription drug management: I ordered medication including IV fluids and antibiotic for presumed sepsis Reevaluation of the patient after these medicines showed that the patient    stayed the same  Critical Interventions:   IV fluids and antibiotics, calcium  and insulin  for hyperkalemia  Consultations Obtained: I requested consultation with the admitting physician ICU, and discussed  findings as well as pertinent plan - they recommend: Will evaluate for admission  Reevaluation: After the interventions noted above, I reevaluated the patient and found that they have :improved  Complexity of problems addressed: Patients presentation is most consistent with  acute presentation with potential threat to life or bodily function  Disposition: After consideration of the diagnostic results and the patients response to treatment,  I feel that the patent would benefit from admission  .       Final diagnoses:  Delirium  Hypothermia, initial encounter  Hyperkalemia  Atrial fibrillation  with slow ventricular response [facility])    ED Discharge Orders     None          Midge Golas, MD 09/07/24 9691  "

## 2024-09-07 ENCOUNTER — Encounter (HOSPITAL_COMMUNITY): Payer: Self-pay | Admitting: Internal Medicine

## 2024-09-07 ENCOUNTER — Inpatient Hospital Stay (HOSPITAL_COMMUNITY)

## 2024-09-07 ENCOUNTER — Emergency Department (HOSPITAL_COMMUNITY)

## 2024-09-07 DIAGNOSIS — D6489 Other specified anemias: Secondary | ICD-10-CM | POA: Diagnosis present

## 2024-09-07 DIAGNOSIS — I482 Chronic atrial fibrillation, unspecified: Secondary | ICD-10-CM | POA: Diagnosis present

## 2024-09-07 DIAGNOSIS — L89623 Pressure ulcer of left heel, stage 3: Secondary | ICD-10-CM | POA: Diagnosis present

## 2024-09-07 DIAGNOSIS — G9340 Encephalopathy, unspecified: Secondary | ICD-10-CM | POA: Diagnosis not present

## 2024-09-07 DIAGNOSIS — Z7901 Long term (current) use of anticoagulants: Secondary | ICD-10-CM | POA: Diagnosis not present

## 2024-09-07 DIAGNOSIS — L89159 Pressure ulcer of sacral region, unspecified stage: Secondary | ICD-10-CM | POA: Diagnosis present

## 2024-09-07 DIAGNOSIS — R1312 Dysphagia, oropharyngeal phase: Secondary | ICD-10-CM | POA: Diagnosis present

## 2024-09-07 DIAGNOSIS — E875 Hyperkalemia: Secondary | ICD-10-CM | POA: Diagnosis present

## 2024-09-07 DIAGNOSIS — R609 Edema, unspecified: Secondary | ICD-10-CM

## 2024-09-07 DIAGNOSIS — R319 Hematuria, unspecified: Secondary | ICD-10-CM | POA: Diagnosis not present

## 2024-09-07 DIAGNOSIS — N1831 Chronic kidney disease, stage 3a: Secondary | ICD-10-CM | POA: Diagnosis present

## 2024-09-07 DIAGNOSIS — D631 Anemia in chronic kidney disease: Secondary | ICD-10-CM | POA: Diagnosis present

## 2024-09-07 DIAGNOSIS — C786 Secondary malignant neoplasm of retroperitoneum and peritoneum: Secondary | ICD-10-CM | POA: Diagnosis present

## 2024-09-07 DIAGNOSIS — G9341 Metabolic encephalopathy: Secondary | ICD-10-CM | POA: Diagnosis present

## 2024-09-07 DIAGNOSIS — C779 Secondary and unspecified malignant neoplasm of lymph node, unspecified: Secondary | ICD-10-CM | POA: Diagnosis present

## 2024-09-07 DIAGNOSIS — Z1152 Encounter for screening for COVID-19: Secondary | ICD-10-CM | POA: Diagnosis not present

## 2024-09-07 DIAGNOSIS — C61 Malignant neoplasm of prostate: Secondary | ICD-10-CM | POA: Diagnosis present

## 2024-09-07 DIAGNOSIS — I4891 Unspecified atrial fibrillation: Secondary | ICD-10-CM | POA: Diagnosis not present

## 2024-09-07 DIAGNOSIS — C7801 Secondary malignant neoplasm of right lung: Secondary | ICD-10-CM | POA: Diagnosis present

## 2024-09-07 DIAGNOSIS — R410 Disorientation, unspecified: Secondary | ICD-10-CM | POA: Diagnosis not present

## 2024-09-07 DIAGNOSIS — D6959 Other secondary thrombocytopenia: Secondary | ICD-10-CM | POA: Diagnosis present

## 2024-09-07 DIAGNOSIS — Z781 Physical restraint status: Secondary | ICD-10-CM | POA: Diagnosis not present

## 2024-09-07 DIAGNOSIS — I129 Hypertensive chronic kidney disease with stage 1 through stage 4 chronic kidney disease, or unspecified chronic kidney disease: Secondary | ICD-10-CM | POA: Diagnosis present

## 2024-09-07 DIAGNOSIS — Z515 Encounter for palliative care: Secondary | ICD-10-CM | POA: Diagnosis not present

## 2024-09-07 DIAGNOSIS — E039 Hypothyroidism, unspecified: Secondary | ICD-10-CM | POA: Diagnosis present

## 2024-09-07 DIAGNOSIS — C781 Secondary malignant neoplasm of mediastinum: Secondary | ICD-10-CM | POA: Diagnosis present

## 2024-09-07 DIAGNOSIS — C7951 Secondary malignant neoplasm of bone: Secondary | ICD-10-CM | POA: Diagnosis present

## 2024-09-07 DIAGNOSIS — A419 Sepsis, unspecified organism: Secondary | ICD-10-CM | POA: Diagnosis present

## 2024-09-07 DIAGNOSIS — T68XXXA Hypothermia, initial encounter: Secondary | ICD-10-CM | POA: Diagnosis not present

## 2024-09-07 DIAGNOSIS — Z66 Do not resuscitate: Secondary | ICD-10-CM | POA: Diagnosis present

## 2024-09-07 DIAGNOSIS — J189 Pneumonia, unspecified organism: Secondary | ICD-10-CM | POA: Diagnosis present

## 2024-09-07 LAB — PHOSPHORUS: Phosphorus: 2.8 mg/dL (ref 2.5–4.6)

## 2024-09-07 LAB — BASIC METABOLIC PANEL WITH GFR
Anion gap: 10 (ref 5–15)
Anion gap: 9 (ref 5–15)
Anion gap: 10 (ref 5–15)
BUN: 20 mg/dL (ref 8–23)
BUN: 20 mg/dL (ref 8–23)
BUN: 22 mg/dL (ref 8–23)
CO2: 19 mmol/L — ABNORMAL LOW (ref 22–32)
CO2: 19 mmol/L — ABNORMAL LOW (ref 22–32)
CO2: 20 mmol/L — ABNORMAL LOW (ref 22–32)
Calcium: 9.4 mg/dL (ref 8.9–10.3)
Calcium: 9.6 mg/dL (ref 8.9–10.3)
Calcium: 9.1 mg/dL (ref 8.9–10.3)
Chloride: 110 mmol/L (ref 98–111)
Chloride: 110 mmol/L (ref 98–111)
Chloride: 109 mmol/L (ref 98–111)
Creatinine, Ser: 1.31 mg/dL — ABNORMAL HIGH (ref 0.61–1.24)
Creatinine, Ser: 1.35 mg/dL — ABNORMAL HIGH (ref 0.61–1.24)
Creatinine, Ser: 1.5 mg/dL — ABNORMAL HIGH (ref 0.61–1.24)
GFR, Estimated: 49 mL/min — ABNORMAL LOW
GFR, Estimated: 51 mL/min — ABNORMAL LOW
GFR, Estimated: 43 mL/min — ABNORMAL LOW
Glucose, Bld: 148 mg/dL — ABNORMAL HIGH (ref 70–99)
Glucose, Bld: 148 mg/dL — ABNORMAL HIGH (ref 70–99)
Glucose, Bld: 76 mg/dL (ref 70–99)
Potassium: 6 mmol/L — ABNORMAL HIGH (ref 3.5–5.1)
Potassium: 6.1 mmol/L — ABNORMAL HIGH (ref 3.5–5.1)
Potassium: 6.3 mmol/L (ref 3.5–5.1)
Sodium: 139 mmol/L (ref 135–145)
Sodium: 139 mmol/L (ref 135–145)
Sodium: 139 mmol/L (ref 135–145)

## 2024-09-07 LAB — RESPIRATORY PANEL BY PCR

## 2024-09-07 LAB — URINALYSIS, ROUTINE W REFLEX MICROSCOPIC
Bilirubin Urine: NEGATIVE
Glucose, UA: NEGATIVE mg/dL
Ketones, ur: NEGATIVE mg/dL
Leukocytes,Ua: NEGATIVE
Nitrite: NEGATIVE
Protein, ur: NEGATIVE mg/dL
Specific Gravity, Urine: 1.02 (ref 1.005–1.030)
pH: 7 (ref 5.0–8.0)

## 2024-09-07 LAB — CBC WITH DIFFERENTIAL/PLATELET
Abs Immature Granulocytes: 0.03 K/uL (ref 0.00–0.07)
Basophils Absolute: 0 K/uL (ref 0.0–0.1)
Basophils Relative: 0 %
Eosinophils Absolute: 0.1 K/uL (ref 0.0–0.5)
Eosinophils Relative: 1 %
HCT: 28.8 % — ABNORMAL LOW (ref 39.0–52.0)
Hemoglobin: 9.4 g/dL — ABNORMAL LOW (ref 13.0–17.0)
Immature Granulocytes: 0 %
Lymphocytes Relative: 13 %
Lymphs Abs: 0.9 K/uL (ref 0.7–4.0)
MCH: 30.7 pg (ref 26.0–34.0)
MCHC: 32.6 g/dL (ref 30.0–36.0)
MCV: 94.1 fL (ref 80.0–100.0)
Monocytes Absolute: 0.2 K/uL (ref 0.1–1.0)
Monocytes Relative: 3 %
Neutro Abs: 5.7 K/uL (ref 1.7–7.7)
Neutrophils Relative %: 83 %
Platelets: 143 K/uL — ABNORMAL LOW (ref 150–400)
RBC: 3.06 MIL/uL — ABNORMAL LOW (ref 4.22–5.81)
RDW: 16.5 % — ABNORMAL HIGH (ref 11.5–15.5)
WBC: 7 K/uL (ref 4.0–10.5)
nRBC: 0 % (ref 0.0–0.2)

## 2024-09-07 LAB — COMPREHENSIVE METABOLIC PANEL WITH GFR
ALT: 24 U/L (ref 0–44)
AST: 39 U/L (ref 15–41)
Albumin: 3.6 g/dL (ref 3.5–5.0)
Alkaline Phosphatase: 99 U/L (ref 38–126)
Anion gap: 8 (ref 5–15)
BUN: 20 mg/dL (ref 8–23)
CO2: 22 mmol/L (ref 22–32)
Calcium: 9.7 mg/dL (ref 8.9–10.3)
Chloride: 109 mmol/L (ref 98–111)
Creatinine, Ser: 1.34 mg/dL — ABNORMAL HIGH (ref 0.61–1.24)
GFR, Estimated: 50 mL/min — ABNORMAL LOW
Glucose, Bld: 132 mg/dL — ABNORMAL HIGH (ref 70–99)
Potassium: 6 mmol/L — ABNORMAL HIGH (ref 3.5–5.1)
Sodium: 140 mmol/L (ref 135–145)
Total Bilirubin: 0.5 mg/dL (ref 0.0–1.2)
Total Protein: 5.8 g/dL — ABNORMAL LOW (ref 6.5–8.1)

## 2024-09-07 LAB — AMMONIA: Ammonia: 27 umol/L (ref 9–35)

## 2024-09-07 LAB — TSH: TSH: 10.5 u[IU]/mL — ABNORMAL HIGH (ref 0.350–4.500)

## 2024-09-07 LAB — MAGNESIUM: Magnesium: 1.6 mg/dL — ABNORMAL LOW (ref 1.7–2.4)

## 2024-09-07 LAB — PRO BRAIN NATRIURETIC PEPTIDE: Pro Brain Natriuretic Peptide: 611 pg/mL — ABNORMAL HIGH

## 2024-09-07 LAB — URINE CULTURE: Culture: <10000 — AB

## 2024-09-07 LAB — URINALYSIS, MICROSCOPIC (REFLEX)

## 2024-09-07 LAB — PROTIME-INR
INR: 1.3 — ABNORMAL HIGH (ref 0.8–1.2)
Prothrombin Time: 16.7 s — ABNORMAL HIGH (ref 11.4–15.2)

## 2024-09-07 LAB — GLUCOSE, CAPILLARY
Glucose-Capillary: 113 mg/dL — ABNORMAL HIGH (ref 70–99)
Glucose-Capillary: 54 mg/dL — ABNORMAL LOW (ref 70–99)
Glucose-Capillary: 63 mg/dL — ABNORMAL LOW (ref 70–99)

## 2024-09-07 LAB — CBC
HCT: 24.6 % — ABNORMAL LOW (ref 39.0–52.0)
Hemoglobin: 7.9 g/dL — ABNORMAL LOW (ref 13.0–17.0)
MCH: 30.6 pg (ref 26.0–34.0)
MCHC: 32.1 g/dL (ref 30.0–36.0)
MCV: 95.3 fL (ref 80.0–100.0)
Platelets: 123 K/uL — ABNORMAL LOW (ref 150–400)
RBC: 2.58 MIL/uL — ABNORMAL LOW (ref 4.22–5.81)
RDW: 16.5 % — ABNORMAL HIGH (ref 11.5–15.5)
WBC: 9 K/uL (ref 4.0–10.5)
nRBC: 0 % (ref 0.0–0.2)

## 2024-09-07 LAB — URINE DRUG SCREEN
Amphetamines: NEGATIVE
Barbiturates: NEGATIVE
Benzodiazepines: NEGATIVE
Cocaine: NEGATIVE
Fentanyl: NEGATIVE
Methadone Scn, Ur: NEGATIVE
Opiates: NEGATIVE
Tetrahydrocannabinol: NEGATIVE

## 2024-09-07 LAB — MRSA NEXT GEN BY PCR, NASAL: MRSA by PCR Next Gen: NOT DETECTED

## 2024-09-07 LAB — RESP PANEL BY RT-PCR (RSV, FLU A&B, COVID)  RVPGX2
Influenza A by PCR: NEGATIVE
Influenza B by PCR: NEGATIVE
Resp Syncytial Virus by PCR: NEGATIVE
SARS Coronavirus 2 by RT PCR: NEGATIVE

## 2024-09-07 LAB — T4, FREE: Free T4: 1.12 ng/dL (ref 0.80–2.00)

## 2024-09-07 LAB — TYPE AND SCREEN
ABO/RH(D): O POS
Antibody Screen: NEGATIVE

## 2024-09-07 LAB — TROPONIN T, HIGH SENSITIVITY
Troponin T High Sensitivity: 38 ng/L — ABNORMAL HIGH (ref 0–19)
Troponin T High Sensitivity: 42 ng/L — ABNORMAL HIGH (ref 0–19)

## 2024-09-07 LAB — CBG MONITORING, ED: Glucose-Capillary: 171 mg/dL — ABNORMAL HIGH (ref 70–99)

## 2024-09-07 LAB — CREATININE, SERUM
Creatinine, Ser: 1.27 mg/dL — ABNORMAL HIGH (ref 0.61–1.24)
GFR, Estimated: 53 mL/min — ABNORMAL LOW

## 2024-09-07 LAB — STREP PNEUMONIAE URINARY ANTIGEN: Strep Pneumo Urinary Antigen: NEGATIVE

## 2024-09-07 LAB — ABO/RH: ABO/RH(D): O POS

## 2024-09-07 LAB — CORTISOL: Cortisol, Plasma: 22.1 ug/dL

## 2024-09-07 MED ORDER — SODIUM CHLORIDE 0.9 % IV SOLN
500.0000 mg | INTRAVENOUS | Status: DC
  Administered 2024-09-07 (×2): 500 mg via INTRAVENOUS
  Filled 2024-09-07 (×3): qty 5

## 2024-09-07 MED ORDER — SODIUM POLYSTYRENE SULFONATE 15 GM/60ML CO SUSP: 30.0000 g | Freq: Once | Status: DC

## 2024-09-07 MED ORDER — ATROPINE SULFATE 1 MG/10ML IJ SOSY
1.0000 mg | PREFILLED_SYRINGE | INTRAMUSCULAR | Status: DC | PRN
  Filled 2024-09-07 (×2): qty 10

## 2024-09-07 MED ORDER — INSULIN ASPART 100 UNIT/ML IV SOLN
5.0000 [IU] | Freq: Once | INTRAVENOUS | Status: AC
  Administered 2024-09-07: 5 [IU] via INTRAVENOUS
  Filled 2024-09-07: qty 5

## 2024-09-07 MED ORDER — SODIUM CHLORIDE 0.9 % IV SOLN
2.0000 g | Freq: Every day | INTRAVENOUS | Status: DC
  Administered 2024-09-07 – 2024-09-08 (×2): 2 g via INTRAVENOUS
  Filled 2024-09-07 (×2): qty 20

## 2024-09-07 MED ORDER — SODIUM ZIRCONIUM CYCLOSILICATE 10 G PO PACK
10.0000 g | PACK | Freq: Four times a day (QID) | ORAL | Status: AC
  Administered 2024-09-07: 10 g
  Filled 2024-09-07: qty 1

## 2024-09-07 MED ORDER — SODIUM CHLORIDE 0.9 % IV SOLN
2.0000 g | Freq: Two times a day (BID) | INTRAVENOUS | Status: DC
  Administered 2024-09-07: 2 g via INTRAVENOUS
  Filled 2024-09-07: qty 12.5

## 2024-09-07 MED ORDER — MAGNESIUM SULFATE 2 GM/50ML IV SOLN
2.0000 g | Freq: Once | INTRAVENOUS | Status: AC
  Administered 2024-09-07: 2 g via INTRAVENOUS
  Filled 2024-09-07: qty 50

## 2024-09-07 MED ORDER — QUETIAPINE FUMARATE 25 MG PO TABS: 25.0000 mg | ORAL_TABLET | Freq: Once | ORAL | Status: DC

## 2024-09-07 MED ORDER — PROSOURCE TF20 ENFIT COMPATIBL EN LIQD: 60.0000 mL | Freq: Every day | ENTERAL | Status: DC

## 2024-09-07 MED ORDER — LORAZEPAM 2 MG/ML IJ SOLN
INTRAMUSCULAR | Status: AC
  Filled 2024-09-07: qty 1

## 2024-09-07 MED ORDER — LORAZEPAM 2 MG/ML IJ SOLN
0.5000 mg | Freq: Once | INTRAMUSCULAR | Status: DC
  Filled 2024-09-07: qty 1

## 2024-09-07 MED ORDER — LORAZEPAM 2 MG/ML IJ SOLN
0.5000 mg | Freq: Once | INTRAMUSCULAR | Status: AC
  Administered 2024-09-07: 0.5 mg via INTRAVENOUS

## 2024-09-07 MED ORDER — SODIUM BICARBONATE 8.4 % IV SOLN
50.0000 meq | Freq: Once | INTRAVENOUS | Status: AC
  Administered 2024-09-07: 50 meq via INTRAVENOUS
  Filled 2024-09-07: qty 50

## 2024-09-07 MED ORDER — OSMOLITE 1.2 CAL PO LIQD
1000.0000 mL | ORAL | Status: DC
  Administered 2024-09-07: 1000 mL
  Filled 2024-09-07 (×2): qty 1000

## 2024-09-07 MED ORDER — SENNA 8.6 MG PO TABS: 1.0000 | ORAL_TABLET | Freq: Two times a day (BID) | ORAL | Status: DC | PRN

## 2024-09-07 MED ORDER — CALCIUM GLUCONATE-NACL 1-0.675 GM/50ML-% IV SOLN
1.0000 g | Freq: Once | INTRAVENOUS | Status: AC
  Administered 2024-09-07: 1000 mg via INTRAVENOUS
  Filled 2024-09-07: qty 50

## 2024-09-07 MED ORDER — DEXTROSE 50 % IV SOLN
12.5000 g | INTRAVENOUS | Status: AC
  Administered 2024-09-08: 12.5 g via INTRAVENOUS
  Filled 2024-09-07: qty 50

## 2024-09-07 MED ORDER — SODIUM ZIRCONIUM CYCLOSILICATE 10 G PO PACK
10.0000 g | PACK | Freq: Four times a day (QID) | ORAL | Status: DC
  Administered 2024-09-07: 10 g via ORAL
  Filled 2024-09-07: qty 1

## 2024-09-07 MED ORDER — POLYETHYLENE GLYCOL 3350 17 G PO PACK: 17.0000 g | PACK | Freq: Every day | ORAL | Status: DC | PRN

## 2024-09-07 MED ORDER — CHLORHEXIDINE GLUCONATE CLOTH 2 % EX PADS
6.0000 | MEDICATED_PAD | Freq: Every day | CUTANEOUS | Status: DC
  Administered 2024-09-07 – 2024-09-08 (×2): 6 via TOPICAL

## 2024-09-07 MED ORDER — THIAMINE MONONITRATE 100 MG PO TABS
100.0000 mg | ORAL_TABLET | Freq: Every day | ORAL | Status: DC
  Administered 2024-09-07 – 2024-09-08 (×2): 100 mg
  Filled 2024-09-07 (×2): qty 1

## 2024-09-07 MED ORDER — VANCOMYCIN HCL 1250 MG/250ML IV SOLN: 1250.0000 mg | INTRAVENOUS | Status: DC

## 2024-09-07 MED ORDER — DEXTROSE 50 % IV SOLN
1.0000 | Freq: Once | INTRAVENOUS | Status: AC
  Administered 2024-09-07: 50 mL via INTRAVENOUS
  Filled 2024-09-07: qty 50

## 2024-09-07 MED ORDER — HEPARIN SODIUM (PORCINE) 5000 UNIT/ML IJ SOLN
5000.0000 [IU] | Freq: Three times a day (TID) | INTRAMUSCULAR | Status: DC
  Administered 2024-09-07 – 2024-09-08 (×4): 5000 [IU] via SUBCUTANEOUS
  Filled 2024-09-07 (×4): qty 1

## 2024-09-07 MED ORDER — QUETIAPINE FUMARATE 25 MG PO TABS
25.0000 mg | ORAL_TABLET | Freq: Two times a day (BID) | ORAL | Status: DC
  Administered 2024-09-07 – 2024-09-08 (×2): 25 mg
  Filled 2024-09-07 (×2): qty 1

## 2024-09-07 NOTE — ED Notes (Signed)
 Labels tubed to lab for add on for urine and resp panel.

## 2024-09-07 NOTE — Progress Notes (Signed)
 Ready to receive report. [employer] [phone]

## 2024-09-07 NOTE — H&P (Signed)
 "  NAME:  Justin Hull, MRN:  [id number], DOB:  09/05/1929, LOS: 0 ADMISSION DATE:  09/06/2024 CONSULTATION DATE:  09/07/2024 REFERRING MD:  Midge - EDP, CHIEF COMPLAINT:  AMS   History of Present Illness:  89 year old man who presented to [facility] ED 1/8 from [facility] via EMS for AMS. PMHx significant for HTN, AF (on Eliquis), prostate CA.  History is obtained primarily from chart review and from patient's sons, at bedside.Patient reportedly had been more confused over the last few days. Labs were obtained with K 6.7; staff attempted to give Lokelma  but patient was unable to drink. On EMS arrival, patient was given bicarb and Ca gluconate. He was intermittently combative requiring Haldol  administration. Bair Hugger was applied. Of note, recently placed on new chemo agent (Xtandi ) ~12/22; he was having significant diarrhea with this and it was held per provider to assess resolution of symptoms. K was low at the time (2.5). Recently on ciprofloxacin for a UTI  On ED arrival, patient was hypothermic to 91.44F, bradycardic with HR 50s, normotensive (114/55), RR 23, SpO2 97%. Labs were notable for WBC 7.0, Hgb 9.4, Plt 143. INR 1.3. Na 140, K 6.0, CO2 22, Cr 1.34 (baseline). LFTs WNL. LA 1.9. TSH 10.5, T4 1.12. Trop 42, BNP 611. COVID/Flu/RSV negative. UA +mod Hgb, otherwise unremarkable. CXR with bilateral lower airspace opacities, edema vs. infection. CT Head NAICA, +atrophy/chronic small vessel disease. Cefepime josephus were initiated for broad-spectrum antibiotic coverage.  PCCM consulted for ICU admission/evaluation.  Pertinent Medical History:   Past Medical History:  Diagnosis Date   Atrial fibrillation (HCC) 03/13/2015   Essential hypertension 03/13/2015   Prostate cancer (HCC)    Significant Hospital Events: Including procedures, antibiotic start and stop dates in addition to other pertinent events   1/8 - Presented to ED via EMS from [facility] for confusion, hyperK to 6.7. Chemically shifted.  CXR c/f PNA, CT Head NAICA. 1/9 - PCCM consulted for ICU admission given ongoing intermittent bradycardia.  Interim History / Subjective:  PCCM consulted for ICU admission.  Objective:  Blood pressure (!) 114/55, pulse (!) 55, temperature (!) 91.3 F (32.9 C), resp. rate (!) 23, height 5' 10 (1.778 m), weight 64 kg, SpO2 97%.       No intake or output data in the 24 hours ending 09/07/24 0344 Filed Weights   09/06/24 2308  Weight: 64 kg   Physical Examination: General: Acute-on-chronically ill-appearing elderly man in NAD. Appears mildly uncomfortable. HEENT: Star/AT, anicteric sclera, PERRL, dry mucous membranes. Neuro: Lethargic. Responds to verbal stimuli. Following commands intermittently. Generalized weakness. CV: Irregularly irregular rhythm, rate 50s, no m/g/r. PULM: Breathing even and unlabored on RA. Lung fields coarse with diminished breath sounds at bilateral bases. GI: Soft, nontender, nondistended. Normoactive bowel sounds. Extremities: Bilateral symmetric 2+ pitting LE edema noted. Skin: Warm/dry, no rashes. Wound to L posterior heel with dressing in place, reportedly improved from prior.  Resolved Hospital Problem List:    Assessment & Plan:  Acute encephalopathy, presume multifactorial in the setting of ?sepsis, metabolic derangements CT NAICA. - Admit to ICU, monitor closely - Correct metabolic derangements - Limit sedating medications as able - Delirium precautions  Concern for pneumonia Hypothermia - Broad-spectrum antibiotics (cefepime /vanc/azithromycin ), narrow as able - F/u eRVP - Supplemental O2 support for O2 sat > 90% - Pulmonary hygiene - NTS PRN - Follow CXR, VBG if needed - Continue Bair Hugger  Bradycardia Atrial fibrillation Query if some of this is electrolyte derangement-driven. - Atropine  PRN - Cardiac monitoring -  Optimize electrolytes for K > 4, Mg > 2 - Hold home Eliquis for now  Elevated TSH Free T4 WNL. - F/u free T3 -  Cardiac monitoring as above  Hyperkalemia - Trend BMP, s/p shifting - Replete electrolytes as indicated - Monitor I&Os - Avoid nephrotoxic agents as able - Ensure adequate renal perfusion  L posterior heel wound, POA - WOC consult  Prostate CA Hematuria PET CT 03/2024 with significant disease progression since 05/2020 with enlargement of L pelvic local recurrence/bladder involvement and secondary L-sided hydroureteronephrosis, nodal metastasis to mediastinum, R hilum, abdominal retroperitoneum, moderate volume new osseous mets, R hepatic lobe lesion. - Will make oncology aware of admission - Currently on chemo (Xtandi , Xgeva , Firmagon)  GOC - DNR/DNI per patient/family wishes - Please see IPAL discussion (note from Dr. Kara) - Will treat what we can, will not escalate significantly if not making progress  Labs:  CBC: Recent Labs  Lab 09/06/24 2328 09/06/24 2351  WBC  --  7.0  NEUTROABS  --  5.7  HGB 10.2*  10.2* 9.4*  HCT 30.0*  30.0* 28.8*  MCV  --  94.1  PLT  --  143*   Basic Metabolic Panel: Recent Labs  Lab 09/06/24 2328 09/06/24 2351  NA 141  141 140  K 5.9*  5.9* 6.0*  CL 110 109  CO2  --  22  GLUCOSE 126* 132*  BUN 20 20  CREATININE 1.40* 1.34*  CALCIUM   --  9.7   GFR: Estimated Creatinine Clearance: 31.8 mL/min (A) (by C-G formula based on SCr of 1.34 mg/dL (H)). Recent Labs  Lab 09/06/24 2332 09/06/24 2351  WBC  --  7.0  LATICACIDVEN 1.9  --    Liver Function Tests: Recent Labs  Lab 09/06/24 2351  AST 39  ALT 24  ALKPHOS 99  BILITOT 0.5  PROT 5.8*  ALBUMIN 3.6   No results for input(s): LIPASE, AMYLASE in the last 168 hours. No results for input(s): AMMONIA in the last 168 hours.  ABG:    Component Value Date/Time   HCO3 20.6 09/06/2024 2328   TCO2 20 (L) 09/06/2024 2328   TCO2 22 09/06/2024 2328   ACIDBASEDEF 5.0 (H) 09/06/2024 2328   O2SAT 74 09/06/2024 2328    Coagulation Profile: Recent Labs  Lab  09/06/24 2351  INR 1.3*   Cardiac Enzymes: No results for input(s): CKTOTAL, CKMB, CKMBINDEX, TROPONINI in the last 168 hours.  HbA1C: No results found for: HGBA1C  CBG: Recent Labs  Lab 09/06/24 2322 09/07/24 0314  GLUCAP 110* 171*   Review of Systems:   Patient is encephalopathic and/or intubated; therefore, history has been obtained from chart review.   Past Medical History:  He,  has a past medical history of Atrial fibrillation (HCC) (03/13/2015), Essential hypertension (03/13/2015), and Prostate cancer (HCC).   Surgical History:   Past Surgical History:  Procedure Laterality Date   BUCCAL MASS EXCISION Right 2019   DONE IN [place], [facility]   HERNIA REPAIR Left    INGUINAL   PROSTATECTOMY     Social History:   reports that he has quit smoking. He has quit using smokeless tobacco. He reports that he does not currently use alcohol after a past usage of about 1.0 standard drink of alcohol per week. He reports that he does not use drugs.   Family History:  His family history includes Dementia in his sister; Diabetes in his brother and sister; Heart attack in his brother and father; Heart disease in his  brother and sister; Hypertension in his father; Prostate cancer in his brother.   Allergies: Allergies[1]   Home Medications: Prior to Admission medications  Medication Sig Start Date End Date Taking? Authorizing Provider  acetaminophen  (TYLENOL ) 325 MG tablet Take 650 mg by mouth every 6 (six) hours as needed for mild pain (pain score 1-3) or moderate pain (pain score 4-6).    [provider]  amLODipine  (NORVASC ) 2.5 MG tablet Take 1 tablet (2.5 mg total) by mouth daily. 08/15/24   Madireddy, Alean SAUNDERS, MD  bethanechol (URECHOLINE) 10 MG tablet Take 10 mg by mouth 3 (three) times daily. 07/27/24   [provider]  calcium -vitamin D (OSCAL WITH D) 500-5 MG-MCG tablet Take 1 tablet by mouth 2 (two) times daily.    [provider]   ciprofloxacin (CIPRO) 500 MG tablet Take 500 mg by mouth 2 (two) times daily. 08/29/24   [provider]  collagenase (SANTYL) 250 UNIT/GM ointment Apply 1 Application topically daily.    [provider]  COMBIGAN 0.2-0.5 % ophthalmic solution instil 1 drop in right eye twice a day 01/18/17   [provider]  dorzolamide (TRUSOPT) 2 % ophthalmic solution Place 1 drop into the right eye 2 (two) times daily. 05/29/24   [provider]  ELIQUIS 2.5 MG TABS tablet Take 2.5 mg by mouth 2 (two) times daily. 07/27/24   [provider]  enzalutamide  (XTANDI ) 80 MG tablet Take 2 tablets (160 mg total) by mouth daily. 08/17/24   Lewis, Dequincy A, MD  erythromycin ophthalmic ointment Place 1 Application into the right eye 3 (three) times daily. 08/16/24   [provider]  latanoprost (XALATAN) 0.005 % ophthalmic solution Place 1 drop into the right eye at bedtime.    [provider]  LORazepam  (ATIVAN ) 0.5 MG tablet Take 0.5 mg by mouth 2 (two) times daily. 08/25/24   [provider]  melatonin 3 MG TABS tablet Take 3 mg by mouth at bedtime.    [provider]  mirtazapine  (REMERON ) 7.5 MG tablet Take 7.5 mg by mouth at bedtime.    [provider]  Multiple Vitamin (MULTI-VITAMIN) tablet Take 1 tablet by mouth daily.    [provider]  omeprazole (PRILOSEC) 40 MG capsule Take 40 mg by mouth daily. 05/08/24   [provider]  pantoprazole (PROTONIX) 40 MG tablet Take 40 mg by mouth daily.    [provider]  pilocarpine (PILOCAR) 1 % ophthalmic solution Place 1 drop into the right eye 3 (three) times daily. 07/31/24   [provider]  potassium chloride  SA (KLOR-CON  M) 20 MEQ tablet Take 1 tablet (20 mEq total) by mouth 2 (two) times daily. 07/23/24   Mosher, Kelli A, PA-C  prednisoLONE acetate (PRED FORTE) 1 % ophthalmic suspension Administer 1 drop into the left eye daily at 0600.  11/06/16   [provider]  ROCKLATAN 0.02-0.005 % SOLN Place 1 drop into the right eye at bedtime. 05/29/24   [provider]  senna (SENOKOT) 8.6 MG TABS tablet Take 2 tablets by mouth at bedtime. 05/31/24   [provider]  sodium bicarbonate  650 MG tablet Take 650 mg by mouth 3 (three) times daily.    [provider]  tamsulosin  (FLOMAX ) 0.4 MG CAPS capsule Take 0.4 mg by mouth at bedtime.    [provider]   Critical care time:   The patient is critically ill with multiple organ system failure and requires high complexity decision making  for assessment and support, frequent evaluation and titration of therapies, advanced monitoring, review of radiographic studies and interpretation of complex data.   Critical Care Time devoted to patient care services, exclusive of separately billable procedures, described in this note is 35 minutes.  Corean CHRISTELLA Bulmaro Feagans, PA-C [facility] 09/07/2024 3:44 AM  Please see [email or web address] for pager details.  From 7A-7P if no response, please call [phone] After hours, please call ELink [phone]    [1] No Known Allergies  "

## 2024-09-07 NOTE — IPAL (Signed)
" °  Interdisciplinary Goals of Care Family Meeting   Date carried out: 09/07/2024  Location of the meeting: Bedside  Member's involved: Physician, Nurse Practitioner, Bedside Registered Nurse, and Family Member or next of kin  Durable Power of Attorney or acting medical decision maker: Justin Hull, Justin Hull  Discussion: We discussed goals of care for Justin Hull .  They confirmed that patient is DNR/DNI, but want to continue medical care at this time to see if patient will improve.   Code status:   Code Status: Limited: Do not attempt resuscitation (DNR) -DNR-LIMITED -Do Not Intubate/DNI    Disposition: Continue current acute care  Time spent for the meeting: 10 minutes    Justin KATHEE Chill, MD  09/07/2024, 3:41 AM   "

## 2024-09-07 NOTE — Progress Notes (Signed)
 Bilateral lower extremity venous duplex has been completed.  Results can be found in chart review under CV Proc.  09/07/2024 7:55 PM  Sho Salguero Elden Appl, RVT.

## 2024-09-07 NOTE — Progress Notes (Signed)
 Pharmacy Antibiotic Note  Justin Hull is a 89 y.o. male admitted on 09/06/2024 with AMS/possible sepsis.  Pharmacy has been consulted for Vancomycin  and Cefepime   dosing.  Plan: Vancomycin  1250 mg IV q48h Cefepime  2 g IV q12h  Height: 5' 10 (177.8 cm) Weight: 64 kg (141 lb 1.5 oz) IBW/kg (Calculated) : 73  Temp (24hrs), Avg:90 F (32.2 C), Min:88.5 F (31.4 C), Max:91.3 F (32.9 C)  Recent Labs  Lab 09/06/24 2328 09/06/24 2332 09/06/24 2351  WBC  --   --  7.0  CREATININE 1.40*  --  1.34*  LATICACIDVEN  --  1.9  --     Estimated Creatinine Clearance: 31.8 mL/min (A) (by C-G formula based on SCr of 1.34 mg/dL (H)).    Allergies[1]  Dail Cordella Misty 09/07/2024 3:52 AM     [1] No Known Allergies

## 2024-09-07 NOTE — Progress Notes (Signed)
 Interim progress note:  Seen in the ICU. On bear hugger rewarming appropriately. Agitated. Not on pressors.   BP 113/84   Pulse 65   Temp (!) 94.8 F (34.9 C)   Resp 16   Ht 5' 10 (1.778 m)   Wt 64 kg   SpO2 100%   BMI 20.24 kg/m  Chronically ill appearing elderly man lying in bed Fidgeting, frequently moving, not following commands but spontaneously moving all extremities.  Wet-sounding cough, no accessory muscle use. Rhonchi. Abd soft, NT ++LE edema bilaterally.  RVP negative, covid negative   A&P:  Hypothermia with bradycardia due to sepsis -rewarming  Sepsis due to pneumonia; a/w encephalopathy, thrombocytopenia  -ceftriaxone  and azithromycin  (qtc ok); ok to stop vanc -collect sputum if able  Acute encephalopathy- suspect due to infection; recently was taking cipro -delirium precautions -need to quantify how frequently he was taking xanax PTA; would lik to avoid this if possible -unsure what baseline moblity is; PT & OT once able to participate  Dysphagia-- present before admission -cortrak -needs SLP once able to participate  Hyperkalemia -lokelma  x 2 today; needs cortrak -recheck this afternoon  Chronic Afib -apixaban low dose  Anemia; about 2g down from baseline a few months ago Thrombocytopenia due to sepsis -monitor for bleeding -PTA on apixaban-- need to ensure he was taking this -[place] heparin  for now for DVT prophylaxis  Hypothyroidism; Free T4 WNL -con't synthroid   CKD 3a -strict I/O -monitor renal function  LE edema -LE US   H/o prostate cancer -OP follow up; hold PTA Xtandi    Leita SHAUNNA Gaskins, DO 09/07/2024 9:57 AM [place]  For contact information, see Amion. If no response to pager, please call PCCM 2H APP. After hours, 7PM- 7AM, please call on call APP for 2H.

## 2024-09-07 NOTE — Progress Notes (Signed)
 Initial Nutrition Assessment  DOCUMENTATION CODES:   Not applicable  INTERVENTION:   Tube Feeding via Cortrak:  Osmolite 1.2 at 55 ml/hr Begin TF at rate of 25 ml/hr, titrate by 10 mL q 8 hours until goal rate of 55 ml/hr TF at goal rate provides 1584 kcals, 73 g of protein and 1069 mL of free water  Check magnesium  and phosphorus as add-on with supplementation if low. Monitor magnesium , potassium, and phosphorus daily for at least 3 days, MD to replete as needed, as pt is at possible risk for refeeding syndrome.   New Measured weight requested  Add Thiamine  100 mg daily x 7 days  NUTRITION DIAGNOSIS:   Inadequate oral intake related to dysphagia, lethargy/confusion as evidenced by NPO status.  GOAL:   Patient will meet greater than or equal to 90% of their needs  MONITOR:   Diet advancement, TF tolerance, Labs, Weight trends  REASON FOR ASSESSMENT:   Consult Assessment of nutrition requirement/status, Enteral/tube feeding initiation and management  ASSESSMENT:   89 yo male admitted with sepsis due to pneumonia, +hypothermia, bradycardia, acute encephalopathy. PMH includes HTN, metastatic prostate cancer  1/09 Admitted  Pt is alert but confused/agitated, pulling at mittens  +hx of Dysphagia, plan for SLP once able to participate.  Currently NPO, Cortrak placed today (gastric)  +Hyperkalemia, lokelma  ordered per tube  Current wt 64 kg but from ED. No weight since arriving to unit. Recommend new measured wt.   Labs: BUN wdl Creatinine 1.35 Sodium 139 (wdl) Potassium 6.0 (H) Phosphorus not checked Magnesium  not checked  Meds: Lokelma  Senna prn Miralax  prn  NUTRITION - FOCUSED PHYSICAL EXAM:  Deferred at this time due to pt's agitation  Diet Order:   Diet Order             Diet NPO time specified  Diet effective now                   EDUCATION NEEDS:   Not appropriate for education at this time  Skin:  Skin Assessment: Skin Integrity  Issues: Skin Integrity Issues:: Stage III Stage III: L Heel  Last BM:  1/9  Height:   Ht Readings from Last 1 Encounters:  09/06/24 5' 10 (1.778 m)    Weight:   Wt Readings from Last 1 Encounters:  09/06/24 64 kg     BMI:  Body mass index is 20.24 kg/m.  Estimated Nutritional Needs:   Kcal:  1500-1700 kcals  Protein:  70-80 g  Fluid:  >/= 1.5 L   Betsey Finger MS, RDN, LDN, CNSC Registered Dietitian 3 Clinical Nutrition RD Inpatient Contact Info in Amion

## 2024-09-07 NOTE — Progress Notes (Signed)
 Evening round: K 6.3, mag 1.6  - mag 2g IV  - for the hyperkalemia, received lokelma  x1 earlier today when K was 6, now 6.3.   - 2nd dose lokelma    - calcium  gluconate 1g   - 1 amp bicarb   - w/ renal dysfunction will give 5U insulin  w/ dextrose   - recheck BMP in 4 hours   Justin FORBES Furth, PA-C [facility] 09/07/2024 7:25 PM  Please see [email or web address] for pager details. From 7A-7P if no response, please call [phone]

## 2024-09-07 NOTE — ED Notes (Signed)
Pt repositioned to the left side

## 2024-09-07 NOTE — Consult Note (Signed)
 This remote consultation was conducted using EMR wound digital images downloaded by the staff member. Pertinent information was reviewed in order to determine recommendations. Coordinated care with primary nurse via Secure Chat.  WOC Nurse Consult Note: Reason for Consult: Left heel PI Wound type:Stage 3  Pressure Injury POA: Yes Wound bed:red full-thickness skin loss Drainage see flowsheet Periwound: denuded Dressing procedure/placement/frequency: BID   Right Sacrum and Left Heel Wounds:Vashe solution #[id number], pat dry with gauze, but do not rinse off, apply 2 layers of cut Xeroform yellow gauze over wound, cover with sacrum Mepilex foam dressing, heel use Mepilex Heel foam dressing #[id number], off-load heels at all times onto pillows/protective boots.   Recommend Vashe solution cleanse to reduce surface bioburden and moist healing with xeroform.   Left Heel 09/07/24  Buttock   Wound type: Buttock; Left sacrum Wound bed: open and denuded, pink, full-thickness skin loss Drainage none noted on image Periwound: induration in appearance, atrophic tissue Dressing procedure/placement/frequency: See above  WOC will not follow and will remove patient from census task list.  Please reconsult if wound worsens in condition and notify provider.   Sherrilyn Hals MSN RN CWOCN WOC [facility]  [phone] (Available from 7-3 pm Mon-Friday)

## 2024-09-07 NOTE — Progress Notes (Signed)
 Agitated>  adding seroquel  via NGT BID  Justin SHAUNNA Gaskins, DO 09/07/2024 4:30 PM [facility]

## 2024-09-07 NOTE — Procedures (Signed)
 Cortrak  Person Inserting Tube:  Justin Hull, RD Tube Type:  Cortrak - 43 inches Tube Size:  10 Tube Location:  Right nare Secured by: Bridle Initial Placement:  Gastric Technique Used to Measure Tube Placement:  Marking at nare/corner of mouth Cortrak Secured At:  64 cm Initial Placement Verification:  Xray  Cortrak Tube Team Note:  Consult received to place a Cortrak feeding tube.   X-ray has been ordered by the Cortrak team. Please confirm tube placement prior to using tube.   If the tube becomes dislodged please keep the tube and contact the Cortrak team at [email or web address] for replacement.  If after hours and replacement cannot be delayed, place a NG tube and confirm placement with an abdominal x-ray.    Trude Ned RD, LDN Contact via [employer].

## 2024-09-08 DIAGNOSIS — E875 Hyperkalemia: Secondary | ICD-10-CM | POA: Diagnosis not present

## 2024-09-08 DIAGNOSIS — R410 Disorientation, unspecified: Secondary | ICD-10-CM

## 2024-09-08 DIAGNOSIS — T68XXXA Hypothermia, initial encounter: Secondary | ICD-10-CM | POA: Diagnosis not present

## 2024-09-08 DIAGNOSIS — I4891 Unspecified atrial fibrillation: Secondary | ICD-10-CM | POA: Diagnosis not present

## 2024-09-08 LAB — BASIC METABOLIC PANEL WITH GFR
Anion gap: 10 (ref 5–15)
Anion gap: 9 (ref 5–15)
BUN: 24 mg/dL — ABNORMAL HIGH (ref 8–23)
BUN: 26 mg/dL — ABNORMAL HIGH (ref 8–23)
CO2: 21 mmol/L — ABNORMAL LOW (ref 22–32)
CO2: 21 mmol/L — ABNORMAL LOW (ref 22–32)
Calcium: 9 mg/dL (ref 8.9–10.3)
Calcium: 9.5 mg/dL (ref 8.9–10.3)
Chloride: 108 mmol/L (ref 98–111)
Chloride: 109 mmol/L (ref 98–111)
Creatinine, Ser: 1.58 mg/dL — ABNORMAL HIGH (ref 0.61–1.24)
Creatinine, Ser: 1.66 mg/dL — ABNORMAL HIGH (ref 0.61–1.24)
GFR, Estimated: 38 mL/min — ABNORMAL LOW
GFR, Estimated: 41 mL/min — ABNORMAL LOW
Glucose, Bld: 106 mg/dL — ABNORMAL HIGH (ref 70–99)
Glucose, Bld: 64 mg/dL — ABNORMAL LOW (ref 70–99)
Potassium: 5.5 mmol/L — ABNORMAL HIGH (ref 3.5–5.1)
Potassium: 5.8 mmol/L — ABNORMAL HIGH (ref 3.5–5.1)
Sodium: 140 mmol/L (ref 135–145)
Sodium: 140 mmol/L (ref 135–145)

## 2024-09-08 LAB — CBC
HCT: 23.9 % — ABNORMAL LOW (ref 39.0–52.0)
Hemoglobin: 7.9 g/dL — ABNORMAL LOW (ref 13.0–17.0)
MCH: 30.9 pg (ref 26.0–34.0)
MCHC: 33.1 g/dL (ref 30.0–36.0)
MCV: 93.4 fL (ref 80.0–100.0)
Platelets: 119 K/uL — ABNORMAL LOW (ref 150–400)
RBC: 2.56 MIL/uL — ABNORMAL LOW (ref 4.22–5.81)
RDW: 16.9 % — ABNORMAL HIGH (ref 11.5–15.5)
WBC: 10 K/uL (ref 4.0–10.5)
nRBC: 0 % (ref 0.0–0.2)

## 2024-09-08 LAB — GLUCOSE, CAPILLARY
Glucose-Capillary: 104 mg/dL — ABNORMAL HIGH (ref 70–99)
Glucose-Capillary: 98 mg/dL (ref 70–99)
Glucose-Capillary: 95 mg/dL (ref 70–99)
Glucose-Capillary: 116 mg/dL — ABNORMAL HIGH (ref 70–99)

## 2024-09-08 LAB — PHOSPHORUS: Phosphorus: 3.7 mg/dL (ref 2.5–4.6)

## 2024-09-08 LAB — T3, FREE: T3, Free: 1.8 pg/mL — ABNORMAL LOW (ref 2.0–4.4)

## 2024-09-08 LAB — MAGNESIUM: Magnesium: 2.1 mg/dL (ref 1.7–2.4)

## 2024-09-08 LAB — LEGIONELLA PNEUMOPHILA SEROGP 1 UR AG: L. pneumophila Serogp 1 Ur Ag: NEGATIVE

## 2024-09-08 MED ORDER — HALOPERIDOL LACTATE 5 MG/ML IJ SOLN
3.0000 mg | Freq: Four times a day (QID) | INTRAMUSCULAR | Status: AC | PRN
  Administered 2024-09-08: 3 mg via INTRAVENOUS
  Filled 2024-09-08: qty 1

## 2024-09-08 MED ORDER — GLYCOPYRROLATE 1 MG PO TABS: 1.0000 mg | ORAL_TABLET | ORAL | Status: DC | PRN

## 2024-09-08 MED ORDER — SODIUM CHLORIDE 0.9% FLUSH: 3.0000 mL | Freq: Two times a day (BID) | INTRAVENOUS | Status: DC

## 2024-09-08 MED ORDER — LORAZEPAM 2 MG/ML PO CONC: 1.0000 mg | ORAL | Status: DC | PRN

## 2024-09-08 MED ORDER — LORAZEPAM 2 MG/ML IJ SOLN: 1.0000 mg | INTRAMUSCULAR | Status: DC | PRN

## 2024-09-08 MED ORDER — MIRTAZAPINE 15 MG PO TABS: 7.5000 mg | ORAL_TABLET | Freq: Every day | ORAL | Status: DC

## 2024-09-08 MED ORDER — NYSTATIN 100000 UNIT/GM EX POWD: Freq: Three times a day (TID) | CUTANEOUS | Status: DC | PRN

## 2024-09-08 MED ORDER — SODIUM CHLORIDE 0.9 % IV SOLN: 12.5000 mg | Freq: Four times a day (QID) | INTRAVENOUS | Status: DC | PRN

## 2024-09-08 MED ORDER — ONDANSETRON HCL 4 MG/2ML IJ SOLN: 4.0000 mg | Freq: Four times a day (QID) | INTRAMUSCULAR | Status: DC | PRN

## 2024-09-08 MED ORDER — HALOPERIDOL LACTATE 5 MG/ML IJ SOLN: 5.0000 mg | INTRAMUSCULAR | Status: DC | PRN

## 2024-09-08 MED ORDER — HYDROMORPHONE HCL-NACL 50-0.9 MG/50ML-% IV SOLN
0.5000 mg/h | INTRAVENOUS | Status: DC
  Administered 2024-09-08: 1 mg/h via INTRAVENOUS
  Filled 2024-09-08: qty 50

## 2024-09-08 MED ORDER — SODIUM CHLORIDE 0.9 % IV SOLN: 250.0000 mL | INTRAVENOUS | Status: DC | PRN

## 2024-09-08 MED ORDER — HALOPERIDOL LACTATE 5 MG/ML IJ SOLN
3.0000 mg | Freq: Once | INTRAMUSCULAR | Status: AC | PRN
  Administered 2024-09-08: 3 mg via INTRAVENOUS
  Filled 2024-09-08: qty 1

## 2024-09-08 MED ORDER — SENNA 8.6 MG PO TABS: 1.0000 | ORAL_TABLET | Freq: Two times a day (BID) | ORAL | Status: DC | PRN

## 2024-09-08 MED ORDER — LORAZEPAM 2 MG/ML IJ SOLN: 1.0000 mg | INTRAMUSCULAR | Status: AC | PRN

## 2024-09-08 MED ORDER — DIPHENHYDRAMINE HCL 50 MG/ML IJ SOLN: 12.5000 mg | INTRAMUSCULAR | Status: DC | PRN

## 2024-09-08 MED ORDER — HYDROMORPHONE HCL-NACL 50-0.9 MG/50ML-% IV SOLN: 0.5000 mg/h | INTRAVENOUS | Status: AC

## 2024-09-08 MED ORDER — POLYETHYLENE GLYCOL 3350 17 G PO PACK: 17.0000 g | PACK | Freq: Every day | ORAL | Status: DC | PRN

## 2024-09-08 MED ORDER — ONDANSETRON 4 MG PO TBDP: 4.0000 mg | ORAL_TABLET | Freq: Four times a day (QID) | ORAL | Status: DC | PRN

## 2024-09-08 MED ORDER — HYDROMORPHONE BOLUS VIA INFUSION: 0.5000 mg | INTRAVENOUS | Status: AC | PRN

## 2024-09-08 MED ORDER — TAMSULOSIN HCL 0.4 MG PO CAPS: 0.4000 mg | ORAL_CAPSULE | Freq: Every day | ORAL | Status: DC

## 2024-09-08 MED ORDER — ACETAMINOPHEN 650 MG RE SUPP: 650.0000 mg | Freq: Four times a day (QID) | RECTAL | Status: DC | PRN

## 2024-09-08 MED ORDER — HYDROMORPHONE BOLUS VIA INFUSION
0.5000 mg | INTRAVENOUS | Status: DC | PRN
  Administered 2024-09-08: 0.5 mg via INTRAVENOUS

## 2024-09-08 MED ORDER — ACETAMINOPHEN 325 MG PO TABS: 650.0000 mg | ORAL_TABLET | Freq: Four times a day (QID) | ORAL | Status: DC | PRN

## 2024-09-08 MED ORDER — SODIUM CHLORIDE 0.9% FLUSH: 3.0000 mL | INTRAVENOUS | Status: DC | PRN

## 2024-09-08 MED ORDER — HALOPERIDOL LACTATE 2 MG/ML PO CONC: 5.0000 mg | ORAL | Status: DC | PRN

## 2024-09-08 MED ORDER — HALOPERIDOL LACTATE 5 MG/ML IJ SOLN: 5.0000 mg | INTRAMUSCULAR | Status: AC | PRN

## 2024-09-08 MED ORDER — GLYCOPYRROLATE 0.2 MG/ML IJ SOLN: 0.2000 mg | INTRAMUSCULAR | Status: DC | PRN

## 2024-09-08 MED ORDER — SODIUM ZIRCONIUM CYCLOSILICATE 10 G PO PACK
10.0000 g | PACK | Freq: Three times a day (TID) | ORAL | Status: DC
  Administered 2024-09-08: 10 g
  Filled 2024-09-08: qty 1

## 2024-09-08 MED ORDER — LEVOTHYROXINE SODIUM 75 MCG PO TABS: 75.0000 ug | ORAL_TABLET | Freq: Every day | ORAL | Status: DC

## 2024-09-08 MED ORDER — LORAZEPAM 1 MG PO TABS: 1.0000 mg | ORAL_TABLET | ORAL | Status: DC | PRN

## 2024-09-08 MED ORDER — BIOTENE DRY MOUTH MT LIQD: 15.0000 mL | OROMUCOSAL | Status: DC | PRN

## 2024-09-08 MED ORDER — HALOPERIDOL 5 MG PO TABS: 5.0000 mg | ORAL_TABLET | ORAL | Status: DC | PRN

## 2024-09-08 MED ORDER — QUETIAPINE FUMARATE 50 MG PO TABS: 50.0000 mg | ORAL_TABLET | Freq: Two times a day (BID) | ORAL | Status: DC

## 2024-09-08 MED ORDER — MELATONIN 3 MG PO TABS: 9.0000 mg | ORAL_TABLET | Freq: Every day | ORAL | Status: DC

## 2024-09-08 NOTE — Plan of Care (Signed)

## 2024-09-08 NOTE — Discharge Summary (Signed)
 "  PATIENT DETAILS Name: Justin Hull Age: 89 y.o. Sex: male Date of Birth: Mar 26, 1930 MRN: [id number]. Admitting Physician: Dorn KATHEE Chill, MD ERE:Dumzzu, Lonni HERO, MD  Admit Date: 09/06/2024 Discharge date: 09/08/2024  Recommendations for Outpatient Follow-up:  Optimize comfort measures  Admitted From:  SNF  Disposition: Residential hospice   Discharge Condition: poor  CODE STATUS:   Code Status: Do not attempt resuscitation (DNR) - Comfort care   Diet recommendation:  Diet Order             Diet NPO time specified  Diet effective now                    Brief Summary: Patient is a 89 y.o.  male with history of prostate cancer, CKD stage IIIa, chronic A-fib-who presented from SNF with AMS-found to have sepsis secondary to PNA.   Significant events: 1/8 >>Presented to ED via EMS from [facility] for confusion, hyperK to 6.7. Chemically shifted. CXR c/f PNA, CT Head NAICA. 1/9 >>PCCM consulted for ICU admission given ongoing intermittent bradycardia. 1/10>> transferred to [facility]-after discussion with family-made comfort care.   Significant studies: 1/8>> CXR: B/L airspace disease 1/8>> CT head: No acute abnormality. 1/8>> TSH: 10.5 (mildly elevated) 1/8>> FT4: Normal 1/9>> B/L lower extremity Doppler: No DVT 1/9>> ammonia: Normal limit   Significant microbiology data: 1/8>> COVID/influenza/RSV PCR: Negative 1/9>> respiratory virus panel: Negative 1/9>> urine culture: Insignificant growth 1/9>> blood culture: No growth 1/9>> urine Legionella antigen: Pending   Procedures: None   Consults: PCCM Palliative care  Brief Hospital Course: Sepsis secondary to pneumonia Although sepsis physiology somewhat better-still remains very encephalopathic Was treated with broad-spectrum antibiotics See below-after discussion with family on 1/10-now comfort care.  Acute metabolic encephalopathy Secondary to above Still very encephalopathic-very restless this  morning-on restraints with mittens. On Seroquel  twice daily via NG tube Bedside sitter ordered earlier this morning After discussion with family-now comfort care-see discussion below.  Bradycardia/hypothermia Secondary to sepsis physiology   Hyperkalemia Likely secondary to CKD 3 Plan is to start scheduled Lokelma  and recheck electrolytes tomorrow However now comfort care-see discussion below.   Oropharyngeal dysphagia Probably chronic but worsened due to acute illness/encephalopathy Had Cortrak tube in place Plan was for SLP eval when more awake and alert Open now comfort care   CKD stage IIIa Close to baseline   Normocytic anemia Probably combination of acute illness/CKD-worsened by acute illness/IV fluid dilution No evidence of blood loss  Thrombocytopenia Mild Likely secondary to sepsis physiology   Chronic atrial fibrillation Heart rate fluctuating from mild bradycardia to normal All rate control agents and Eliquis on hold. No plans resume now comfort care.   Hypothyroidism TSH slightly elevated-levothyroxine  was increased to 75 mcg-however now discontinued due to comfort care.    History of prostate cancer Hold Xtandi -no plans resume-as comfort care  BPH On Flomax -however now has Foley catheter in place   B/L lower extremity edema Dopplers negative   Palliative care DNR I reached out to patient's son Medford earlier this morning-we had a very frank conversation that in spite of 2 days of antibiotics-there has been no tangible improvement-patient remains very encephalopathic-restless and attempting to pull out NG tube and Foley catheter.  He is on mittens and also has a soft wrist restraint this morning.  Although we could continue with current care-I encouraged Medford to talk to his siblings regarding possible transitioning to comfort measures given that this 89 year old has not improved and is clearly suffering.  Family  arrived earlier this afternoon-and they  have had a chance to discuss and now requesting that we transition to full comfort measures.  Subsequently started Dilaudid  infusion-patient has stabilized-family requesting transfer to hospice in [place].  Nutrition Status: Nutrition Problem: Inadequate oral intake Etiology: dysphagia, lethargy/confusion Signs/Symptoms: NPO status Interventions: Refer to RD note for recommendations   Pressure Ulcer: Agree with assessment as outlined below Wound 09/06/24 2300 Pressure Injury Heel Left Stage 3 -  Full thickness tissue loss. Subcutaneous fat may be visible but bone, tendon or muscle are NOT exposed. (Active)     Wound 09/06/24 2300 Pressure Injury Sacrum Medial (Active)   Discharge Diagnoses:  Principal Problem:   Sepsis [facility])   Discharge Instructions:  Activity:  As tolerated with Full fall precautions use walker/cane & assistance as needed   Discharge Instructions     Discharge instructions   Complete by: As directed    Follow with Primary MD  Street, Lonni HERO, MD as needed  Please get a complete blood count and chemistry panel checked by your Primary MD at your next visit, and again as instructed by your Primary MD.  Get Medicines reviewed and adjusted: Please take all your medications with you for your next visit with your Primary MD  Laboratory/radiological data: Please request your Primary MD to go over all hospital tests and procedure/radiological results at the follow up, please ask your Primary MD to get all Hospital records sent to his/her office.  In some cases, they will be blood work, cultures and biopsy results pending at the time of your discharge. Please request that your primary care M.D. follows up on these results.  Also Note the following: If you experience worsening of your admission symptoms, develop shortness of breath, life threatening emergency, suicidal or homicidal thoughts you must seek medical attention immediately by calling 911 or calling  your MD immediately  if symptoms less severe.  You must read complete instructions/literature along with all the possible adverse reactions/side effects for all the Medicines you take and that have been prescribed to you. Take any new Medicines after you have completely understood and accpet all the possible adverse reactions/side effects.   Do not drive when taking Pain medications or sleeping medications (Benzodaizepines)  Do not take more than prescribed Pain, Sleep and Anxiety Medications. It is not advisable to combine anxiety,sleep and pain medications without talking with your primary care practitioner  Special Instructions: If you have smoked or chewed Tobacco  in the last 2 yrs please stop smoking, stop any regular Alcohol  and or any Recreational drug use.  Wear Seat belts while driving.  Please note: You were cared for by a hospitalist during your hospital stay. Once you are discharged, your primary care physician will handle any further medical issues. Please note that NO REFILLS for any discharge medications will be authorized once you are discharged, as it is imperative that you return to your primary care physician (or establish a relationship with a primary care physician if you do not have one) for your post hospital discharge needs so that they can reassess your need for medications and monitor your lab values.   Increase activity slowly   Complete by: As directed    No wound care   Complete by: As directed       Allergies as of 09/08/2024   No Known Allergies      Medication List     STOP taking these medications    acetaminophen  325 MG tablet Commonly  known as: TYLENOL    ALPRAZolam 0.5 MG tablet Commonly known as: XANAX   amLODipine  2.5 MG tablet Commonly known as: NORVASC    apixaban 2.5 MG Tabs tablet Commonly known as: ELIQUIS   brimonidine-timolol 0.2-0.5 % ophthalmic solution Commonly known as: COMBIGAN   calcium  elemental as carbonate 400 MG  chewable tablet Commonly known as: BARIATRIC TUMS ULTRA   Calcium  Oyster Shell 500 MG Tabs   collagenase 250 UNIT/GM ointment Commonly known as: SANTYL   levothyroxine  50 MCG tablet Commonly known as: SYNTHROID    loperamide 2 MG tablet Commonly known as: IMODIUM A-D   magnesium  oxide 400 (240 Mg) MG tablet Commonly known as: MAG-OX   melatonin 3 MG Tabs tablet   mirtazapine  7.5 MG tablet Commonly known as: REMERON    Multi-Vitamin tablet   pantoprazole 40 MG tablet Commonly known as: PROTONIX   potassium chloride  SA 20 MEQ tablet Commonly known as: KLOR-CON  M   QUEtiapine  25 MG tablet Commonly known as: SEROQUEL    sodium bicarbonate  650 MG tablet   tamsulosin  0.4 MG Caps capsule Commonly known as: FLOMAX    Xtandi  80 MG tablet Generic drug: enzalutamide        TAKE these medications    haloperidol  lactate 5 MG/ML injection Commonly known as: HALDOL  Inject 1 mL (5 mg total) into the vein every 4 (four) hours as needed (or delirium).   HYDROmorphone  2 mg/mL Soln Commonly known as: DILAUDID  Inject 0.5-1 mg into the vein every 30 (thirty) minutes as needed (To alleviate signs and symptoms of distress).   HYDROmorphone  HCl-NaCl 50MG /50ML Soln Commonly known as: DILAUDID  Inject 0.5-4 mg/hr into the vein continuous.   LORazepam  2 MG/ML injection Commonly known as: ATIVAN  Inject 0.5 mLs (1 mg total) into the vein every 4 (four) hours as needed for anxiety.        Follow-up Information     Street, Lonni HERO, MD Follow up.   Specialty: Family Medicine Why: As needed Contact information: [address] [place] [address] [phone]                Allergies[1]   Other Procedures/Studies: VAS US  LOWER EXTREMITY VENOUS (DVT) Result Date: 09/08/2024  Lower Venous DVT Study Patient Name:  Justin Hull  Date of Exam:   09/07/2024 Medical Rec #: [id number]    Accession #:    [id number] Date of Birth: [birth year]     Patient Gender: M Patient  Age:   89 years Exam Location:  [facility] Procedure:      VAS US  LOWER EXTREMITY VENOUS (DVT) Referring Phys: LEITA GASKINS --------------------------------------------------------------------------------  Indications: Edema.  Limitations: Combative and uncooperative patient. Comparison Study: No prior exam. Performing Technologist: Edilia Elden Appl  Examination Guidelines: A complete evaluation includes B-mode imaging, spectral Doppler, color Doppler, and power Doppler as needed of all accessible portions of each vessel. Bilateral testing is considered an integral part of a complete examination. Limited examinations for reoccurring indications may be performed as noted. The reflux portion of the exam is performed with the patient in reverse Trendelenburg.  +---------+---------------+---------+-----------+----------+--------------+ RIGHT    CompressibilityPhasicitySpontaneityPropertiesThrombus Aging +---------+---------------+---------+-----------+----------+--------------+ CFV      Full           Yes      Yes                                 +---------+---------------+---------+-----------+----------+--------------+ SFJ      Full  Yes      Yes                                 +---------+---------------+---------+-----------+----------+--------------+ FV Prox  Full                                                        +---------+---------------+---------+-----------+----------+--------------+ FV Mid   Full                                                        +---------+---------------+---------+-----------+----------+--------------+ FV DistalFull                                                        +---------+---------------+---------+-----------+----------+--------------+ PFV      Full                                                        +---------+---------------+---------+-----------+----------+--------------+ POP      Full                                                         +---------+---------------+---------+-----------+----------+--------------+ PTV      Full                                                        +---------+---------------+---------+-----------+----------+--------------+ PERO     Full                                                        +---------+---------------+---------+-----------+----------+--------------+ Limited exam due to patient being combative. Study mostly comprises of compression images with limited doppler images. Best obtainable images.  +---------+---------------+---------+-----------+----------+--------------+ LEFT     CompressibilityPhasicitySpontaneityPropertiesThrombus Aging +---------+---------------+---------+-----------+----------+--------------+ CFV      Full           Yes      Yes                                 +---------+---------------+---------+-----------+----------+--------------+ SFJ      Full                                                        +---------+---------------+---------+-----------+----------+--------------+  FV Prox  Full                                                        +---------+---------------+---------+-----------+----------+--------------+ FV Mid   Full                                                        +---------+---------------+---------+-----------+----------+--------------+ FV DistalFull                                                        +---------+---------------+---------+-----------+----------+--------------+ PFV      Full                                                        +---------+---------------+---------+-----------+----------+--------------+ POP      Full                                                        +---------+---------------+---------+-----------+----------+--------------+ PTV      Full                                                         +---------+---------------+---------+-----------+----------+--------------+ PERO     Full                                                        +---------+---------------+---------+-----------+----------+--------------+ Limited exam due to patient being combative. Study mostly comprises of compression images with limited doppler images. Best obtainable images.    Summary: RIGHT: - There is no evidence of deep vein thrombosis in the lower extremity. However, portions of this examination were limited- see technologist comments above.  - No cystic structure found in the popliteal fossa.  LEFT: - There is no evidence of deep vein thrombosis in the lower extremity. However, portions of this examination were limited- see technologist comments above.  - No cystic structure found in the popliteal fossa.  *See table(s) above for measurements and observations. Electronically signed by Fonda Rim on 09/08/2024 at 8:36:19 AM.    Final    DG Abd Portable 1V Result Date: 09/07/2024 CLINICAL DATA:  Feeding tube placement EXAM: PORTABLE ABDOMEN - 1 VIEW COMPARISON:  None Available. FINDINGS: Distal tip of feeding tube is seen in expected position of proximal stomach. IMPRESSION: Distal tip of feeding tube seen in expected position of proximal  stomach. Electronically Signed   By: Lynwood Landy Raddle M.D.   On: 09/07/2024 12:04   CT HEAD WO CONTRAST Result Date: 09/07/2024 EXAM: CT HEAD WITHOUT CONTRAST 09/07/2024 12:56:13 AM TECHNIQUE: CT of the head was performed without the administration of intravenous contrast. Automated exposure control, iterative reconstruction, and/or weight based adjustment of the mA/kV was utilized to reduce the radiation dose to as low as reasonably achievable. COMPARISON: 07/28/2024 CLINICAL HISTORY: Delirium. FINDINGS: BRAIN AND VENTRICLES: No acute hemorrhage. No evidence of acute infarct. No hydrocephalus. No extra-axial collection. No mass effect or midline shift. Atrophy and chronic small  vessel disease throughout the deep white matter. ORBITS: No acute abnormality. SINUSES: No acute abnormality. SOFT TISSUES AND SKULL: No acute soft tissue abnormality. No skull fracture. IMPRESSION: 1. No acute intracranial abnormality. 2. Atrophy and chronic small vessel ischemic disease in the deep white matter. Electronically signed by: Franky Crease MD MD 09/07/2024 12:59 AM EST RP Workstation: [id number]   DG Chest Port 1 View Result Date: 09/06/2024 EXAM: 1 VIEW XRAY OF THE CHEST 09/06/2024 11:36:24 PM COMPARISON: 07/28/2024 CLINICAL HISTORY: weakness/altered FINDINGS: LUNGS AND PLEURA: Bilateral lower lobe airspace opacities could reflect edema or infection. No pleural effusion. No pneumothorax. HEART AND MEDIASTINUM: Aortic atherosclerosis. No acute abnormality of the cardiac and mediastinal silhouettes. BONES AND SOFT TISSUES: No acute osseous abnormality. IMPRESSION: 1. Bilateral lower lobe airspace opacities, possibly representing edema or infection. Electronically signed by: Franky Crease MD MD 09/06/2024 11:40 PM EST RP Workstation: [id number]     TODAY-DAY OF DISCHARGE:  Subjective:   Beryl Hoard today stable/comfortable-on Dilaudid  infusion.  Objective:   Blood pressure 139/83, pulse (!) 58, temperature (!) 93.4 F (34.1 C), temperature source Rectal, resp. rate 18, height 5' 10 (1.778 m), weight 68.3 kg, SpO2 98%.  Intake/Output Summary (Last 24 hours) at 09/08/2024 1724 Last data filed at 09/08/2024 1620 Gross per 24 hour  Intake 621.05 ml  Output 1085 ml  Net -463.95 ml   Filed Weights   09/06/24 2308 09/08/24 0500  Weight: 64 kg 68.3 kg    Exam: Stable/comfortable on Dilaudid  infusion.  PERTINENT RADIOLOGIC STUDIES: VAS US  LOWER EXTREMITY VENOUS (DVT) Result Date: 09/08/2024  Lower Venous DVT Study Patient Name:  Justin Hull  Date of Exam:   09/07/2024 Medical Rec #: [id number]    Accession #:    [id number] Date of Birth: 02-19-1930     Patient Gender: M Patient Age:   65  years Exam Location:  [facility] Procedure:      VAS US  LOWER EXTREMITY VENOUS (DVT) Referring Phys: LEITA GASKINS --------------------------------------------------------------------------------  Indications: Edema.  Limitations: Combative and uncooperative patient. Comparison Study: No prior exam. Performing Technologist: Edilia Elden Appl  Examination Guidelines: A complete evaluation includes B-mode imaging, spectral Doppler, color Doppler, and power Doppler as needed of all accessible portions of each vessel. Bilateral testing is considered an integral part of a complete examination. Limited examinations for reoccurring indications may be performed as noted. The reflux portion of the exam is performed with the patient in reverse Trendelenburg.  +---------+---------------+---------+-----------+----------+--------------+ RIGHT    CompressibilityPhasicitySpontaneityPropertiesThrombus Aging +---------+---------------+---------+-----------+----------+--------------+ CFV      Full           Yes      Yes                                 +---------+---------------+---------+-----------+----------+--------------+ SFJ      Full  Yes      Yes                                 +---------+---------------+---------+-----------+----------+--------------+ FV Prox  Full                                                        +---------+---------------+---------+-----------+----------+--------------+ FV Mid   Full                                                        +---------+---------------+---------+-----------+----------+--------------+ FV DistalFull                                                        +---------+---------------+---------+-----------+----------+--------------+ PFV      Full                                                        +---------+---------------+---------+-----------+----------+--------------+ POP      Full                                                         +---------+---------------+---------+-----------+----------+--------------+ PTV      Full                                                        +---------+---------------+---------+-----------+----------+--------------+ PERO     Full                                                        +---------+---------------+---------+-----------+----------+--------------+ Limited exam due to patient being combative. Study mostly comprises of compression images with limited doppler images. Best obtainable images.  +---------+---------------+---------+-----------+----------+--------------+ LEFT     CompressibilityPhasicitySpontaneityPropertiesThrombus Aging +---------+---------------+---------+-----------+----------+--------------+ CFV      Full           Yes      Yes                                 +---------+---------------+---------+-----------+----------+--------------+ SFJ      Full                                                        +---------+---------------+---------+-----------+----------+--------------+  FV Prox  Full                                                        +---------+---------------+---------+-----------+----------+--------------+ FV Mid   Full                                                        +---------+---------------+---------+-----------+----------+--------------+ FV DistalFull                                                        +---------+---------------+---------+-----------+----------+--------------+ PFV      Full                                                        +---------+---------------+---------+-----------+----------+--------------+ POP      Full                                                        +---------+---------------+---------+-----------+----------+--------------+ PTV      Full                                                         +---------+---------------+---------+-----------+----------+--------------+ PERO     Full                                                        +---------+---------------+---------+-----------+----------+--------------+ Limited exam due to patient being combative. Study mostly comprises of compression images with limited doppler images. Best obtainable images.    Summary: RIGHT: - There is no evidence of deep vein thrombosis in the lower extremity. However, portions of this examination were limited- see technologist comments above.  - No cystic structure found in the popliteal fossa.  LEFT: - There is no evidence of deep vein thrombosis in the lower extremity. However, portions of this examination were limited- see technologist comments above.  - No cystic structure found in the popliteal fossa.  *See table(s) above for measurements and observations. Electronically signed by Fonda Rim on 09/08/2024 at 8:36:19 AM.    Final    DG Abd Portable 1V Result Date: 09/07/2024 CLINICAL DATA:  Feeding tube placement EXAM: PORTABLE ABDOMEN - 1 VIEW COMPARISON:  None Available. FINDINGS: Distal tip of feeding tube is seen in expected position of proximal stomach. IMPRESSION: Distal tip of feeding tube seen in expected position of proximal  stomach. Electronically Signed   By: Lynwood Landy Raddle M.D.   On: 09/07/2024 12:04   CT HEAD WO CONTRAST Result Date: 09/07/2024 EXAM: CT HEAD WITHOUT CONTRAST 09/07/2024 12:56:13 AM TECHNIQUE: CT of the head was performed without the administration of intravenous contrast. Automated exposure control, iterative reconstruction, and/or weight based adjustment of the mA/kV was utilized to reduce the radiation dose to as low as reasonably achievable. COMPARISON: 07/28/2024 CLINICAL HISTORY: Delirium. FINDINGS: BRAIN AND VENTRICLES: No acute hemorrhage. No evidence of acute infarct. No hydrocephalus. No extra-axial collection. No mass effect or midline shift. Atrophy and chronic small  vessel disease throughout the deep white matter. ORBITS: No acute abnormality. SINUSES: No acute abnormality. SOFT TISSUES AND SKULL: No acute soft tissue abnormality. No skull fracture. IMPRESSION: 1. No acute intracranial abnormality. 2. Atrophy and chronic small vessel ischemic disease in the deep white matter. Electronically signed by: Franky Crease MD MD 09/07/2024 12:59 AM EST RP Workstation: [id number]   DG Chest Port 1 View Result Date: 09/06/2024 EXAM: 1 VIEW XRAY OF THE CHEST 09/06/2024 11:36:24 PM COMPARISON: 07/28/2024 CLINICAL HISTORY: weakness/altered FINDINGS: LUNGS AND PLEURA: Bilateral lower lobe airspace opacities could reflect edema or infection. No pleural effusion. No pneumothorax. HEART AND MEDIASTINUM: Aortic atherosclerosis. No acute abnormality of the cardiac and mediastinal silhouettes. BONES AND SOFT TISSUES: No acute osseous abnormality. IMPRESSION: 1. Bilateral lower lobe airspace opacities, possibly representing edema or infection. Electronically signed by: Franky Crease MD MD 09/06/2024 11:40 PM EST RP Workstation: [id number]     PERTINENT LAB RESULTS: CBC: Recent Labs    09/07/24 0346 09/08/24 0935  WBC 9.0 10.0  HGB 7.9* 7.9*  HCT 24.6* 23.9*  PLT 123* 119*   CMET CMP     Component Value Date/Time   NA 140 09/08/2024 0339   K 5.8 (H) 09/08/2024 0339   CL 108 09/08/2024 0339   CO2 21 (L) 09/08/2024 0339   GLUCOSE 106 (H) 09/08/2024 0339   BUN 24 (H) 09/08/2024 0339   CREATININE 1.66 (H) 09/08/2024 0339   CREATININE 1.49 (H) 08/20/2024 1330   CALCIUM  9.5 09/08/2024 0339   PROT 5.8 (L) 09/06/2024 2351   ALBUMIN 3.6 09/06/2024 2351   AST 39 09/06/2024 2351   AST 22 08/20/2024 1330   ALT 24 09/06/2024 2351   ALT 9 08/20/2024 1330   ALKPHOS 99 09/06/2024 2351   BILITOT 0.5 09/06/2024 2351   BILITOT 0.4 08/20/2024 1330   GFRNONAA 38 (L) 09/08/2024 0339   GFRNONAA 44 (L) 08/20/2024 1330    GFR Estimated Creatinine Clearance: 27.4 mL/min (A) (by C-G  formula based on SCr of 1.66 mg/dL (H)). No results for input(s): LIPASE, AMYLASE in the last 72 hours. No results for input(s): CKTOTAL, CKMB, CKMBINDEX, TROPONINI in the last 72 hours. Invalid input(s): POCBNP No results for input(s): DDIMER in the last 72 hours. No results for input(s): HGBA1C in the last 72 hours. No results for input(s): CHOL, HDL, LDLCALC, TRIG, CHOLHDL, LDLDIRECT in the last 72 hours. Recent Labs    09/06/24 2351  TSH 10.500*  T3FREE 1.8*   No results for input(s): VITAMINB12, FOLATE, FERRITIN, TIBC, IRON, RETICCTPCT in the last 72 hours. Coags: Recent Labs    09/06/24 2351  INR 1.3*   Microbiology: Recent Results (from the past 240 hours)  Resp panel by RT-PCR (RSV, Flu A&B, Covid) Urine, Clean Catch     Status: None   Collection Time: 09/06/24 11:51 PM   Specimen: Urine, Clean Catch; Nasal Swab  Result Value Ref  Range Status   SARS Coronavirus 2 by RT PCR NEGATIVE NEGATIVE Final   Influenza A by PCR NEGATIVE NEGATIVE Final   Influenza B by PCR NEGATIVE NEGATIVE Final    Comment: (NOTE) The Xpert Xpress SARS-CoV-2/FLU/RSV plus assay is intended as an aid in the diagnosis of influenza from Nasopharyngeal swab specimens and should not be used as a sole basis for treatment. Nasal washings and aspirates are unacceptable for Xpert Xpress SARS-CoV-2/FLU/RSV testing.  Fact Sheet for Patients: bloggercourse.com  Fact Sheet for Healthcare Providers: seriousbroker.it  This test is not yet approved or cleared by the [place]  FDA and has been authorized for detection and/or diagnosis of SARS-CoV-2 by FDA under an Emergency Use Authorization (EUA). This EUA will remain in effect (meaning this test can be used) for the duration of the COVID-19 declaration under Section 564(b)(1) of the Act, 21 U.S.C. section 360bbb-3(b)(1), unless the authorization is terminated  or revoked.     Resp Syncytial Virus by PCR NEGATIVE NEGATIVE Final    Comment: (NOTE) Fact Sheet for Patients: bloggercourse.com  Fact Sheet for Healthcare Providers: seriousbroker.it  This test is not yet approved or cleared by the [place]  FDA and has been authorized for detection and/or diagnosis of SARS-CoV-2 by FDA under an Emergency Use Authorization (EUA). This EUA will remain in effect (meaning this test can be used) for the duration of the COVID-19 declaration under Section 564(b)(1) of the Act, 21 U.S.C. section 360bbb-3(b)(1), unless the authorization is terminated or revoked.  Performed at [facility], [address], [place], [place] [address]   Urine Culture     Status: Abnormal   Collection Time: 09/07/24 12:03 AM   Specimen: Urine, Catheterized  Result Value Ref Range Status   Specimen Description URINE, CATHETERIZED  Final   Special Requests NONE  Final   Culture (A)  Final    <10,000 COLONIES/mL INSIGNIFICANT GROWTH Performed at [facility], [address], [place], [place] [address]    Report Status 09/07/2024 FINAL  Final  Blood Culture (routine x 2)     Status: None (Preliminary result)   Collection Time: 09/07/24 12:08 AM   Specimen: BLOOD LEFT ARM  Result Value Ref Range Status   Specimen Description BLOOD LEFT ARM  Final   Special Requests   Final    BOTTLES DRAWN AEROBIC AND ANAEROBIC Blood Culture adequate volume   Culture   Final    NO GROWTH 1 DAY Performed at [facility], [address] [place], [place] [address]    Report Status PENDING  Incomplete  Blood Culture (routine x 2)     Status: None (Preliminary result)   Collection Time: 09/07/24 12:10 AM   Specimen: BLOOD LEFT HAND  Result Value Ref Range Status   Specimen Description BLOOD LEFT HAND  Final   Special Requests   Final    BOTTLES DRAWN AEROBIC AND ANAEROBIC Blood Culture adequate volume   Culture    Final    NO GROWTH 1 DAY Performed at [facility], [address] [place], [place] [address]    Report Status PENDING  Incomplete  Respiratory (~20 pathogens) panel by PCR     Status: None   Collection Time: 09/07/24  3:07 AM   Specimen: Urine, Clean Catch; Respiratory  Result Value Ref Range Status   Adenovirus NOT DETECTED NOT DETECTED Final   Coronavirus 229E NOT DETECTED NOT DETECTED Final    Comment: (NOTE) The Coronavirus on the  Respiratory Panel, DOES NOT test for the novel  Coronavirus (2019 nCoV)    Coronavirus HKU1 NOT DETECTED NOT DETECTED Final   Coronavirus NL63 NOT DETECTED NOT DETECTED Final   Coronavirus OC43 NOT DETECTED NOT DETECTED Final   Metapneumovirus NOT DETECTED NOT DETECTED Final   Rhinovirus / Enterovirus NOT DETECTED NOT DETECTED Final   Influenza A NOT DETECTED NOT DETECTED Final   Influenza B NOT DETECTED NOT DETECTED Final   Parainfluenza Virus 1 NOT DETECTED NOT DETECTED Final   Parainfluenza Virus 2 NOT DETECTED NOT DETECTED Final   Parainfluenza Virus 3 NOT DETECTED NOT DETECTED Final   Parainfluenza Virus 4 NOT DETECTED NOT DETECTED Final   Respiratory Syncytial Virus NOT DETECTED NOT DETECTED Final   Bordetella pertussis NOT DETECTED NOT DETECTED Final   Bordetella Parapertussis NOT DETECTED NOT DETECTED Final   Chlamydophila pneumoniae NOT DETECTED NOT DETECTED Final   Mycoplasma pneumoniae NOT DETECTED NOT DETECTED Final    Comment: Performed at [facility], [address] [place], [place] [address]  MRSA Next Gen by PCR, Nasal     Status: None   Collection Time: 09/07/24  3:46 AM   Specimen: Nasal Mucosa; Nasal Swab  Result Value Ref Range Status   MRSA by PCR Next Gen NOT DETECTED NOT DETECTED Final    Comment: (NOTE) The GeneXpert MRSA Assay (FDA approved for NASAL specimens only), is one component of a comprehensive MRSA colonization surveillance program. It is not intended to diagnose MRSA infection nor to guide or  monitor treatment for MRSA infections. Test performance is not FDA approved in patients less than 56 years old. Performed at [facility], [address] [place], [place] [address]     FURTHER DISCHARGE INSTRUCTIONS:  Get Medicines reviewed and adjusted: Please take all your medications with you for your next visit with your Primary MD  Laboratory/radiological data: Please request your Primary MD to go over all hospital tests and procedure/radiological results at the follow up, please ask your Primary MD to get all Hospital records sent to his/her office.  In some cases, they will be blood work, cultures and biopsy results pending at the time of your discharge. Please request that your primary care M.D. goes through all the records of your hospital data and follows up on these results.  Also Note the following: If you experience worsening of your admission symptoms, develop shortness of breath, life threatening emergency, suicidal or homicidal thoughts you must seek medical attention immediately by calling 911 or calling your MD immediately  if symptoms less severe.  You must read complete instructions/literature along with all the possible adverse reactions/side effects for all the Medicines you take and that have been prescribed to you. Take any new Medicines after you have completely understood and accpet all the possible adverse reactions/side effects.   Do not drive when taking Pain medications or sleeping medications (Benzodaizepines)  Do not take more than prescribed Pain, Sleep and Anxiety Medications. It is not advisable to combine anxiety,sleep and pain medications without talking with your primary care practitioner  Special Instructions: If you have smoked or chewed Tobacco  in the last 2 yrs please stop smoking, stop any regular Alcohol  and or any Recreational drug use.  Wear Seat belts while driving.  Please note: You were cared for by a hospitalist during your  hospital stay. Once you are discharged, your primary care physician will handle any further medical issues. Please note that NO REFILLS for any  discharge medications will be authorized once you are discharged, as it is imperative that you return to your primary care physician (or establish a relationship with a primary care physician if you do not have one) for your post hospital discharge needs so that they can reassess your need for medications and monitor your lab values.  Total Time spent coordinating discharge including counseling, education and face to face time equals greater than 30 minutes.  SignedBETHA Donalda Applebaum 09/08/2024 5:24 PM      [1] No Known Allergies  "

## 2024-09-08 NOTE — Progress Notes (Addendum)
 "                        PROGRESS NOTE        PATIENT DETAILS Name: Justin Hull Age: 89 y.o. Sex: male Date of Birth: 1929/11/22 Admit Date: 09/06/2024 Admitting Physician Dorn KATHEE Chill, MD ERE:Dumzzu, Lonni HERO, MD  Brief Summary: Patient is a 89 y.o.  male with history of prostate cancer, CKD stage IIIa, chronic A-fib-who presented from SNF with AMS-found to have sepsis secondary to PNA.  Significant events: 1/8 >>Presented to ED via EMS from [facility] for confusion, hyperK to 6.7. Chemically shifted. CXR c/f PNA, CT Head NAICA. 1/9 >>PCCM consulted for ICU admission given ongoing intermittent bradycardia. 1/10>> transferred to [facility]  Significant studies: 1/8>> CXR: B/L airspace disease 1/8>> CT head: No acute abnormality. 1/8>> TSH: 10.5 (mildly elevated) 1/8>> FT4: Normal 1/9>> B/L lower extremity Doppler: No DVT 1/9>> ammonia: Normal limit  Significant microbiology data: 1/8>> COVID/influenza/RSV PCR: Negative 1/9>> respiratory virus panel: Negative 1/9>> urine culture: Insignificant growth 1/9>> blood culture: No growth 1/9>> urine Legionella antigen: Pending  Procedures: None  Consults: PCCM Palliative care  Subjective: Confused-agitated-in restraints-has NG tube-Foley.  Not able to be reoriented-attempted to take his mittens out.  No family at bedside.  Objective: Vitals: Blood pressure 128/88, pulse 76, temperature (!) 97.5 F (36.4 C), temperature source Axillary, resp. rate 19, height 5' 10 (1.778 m), weight 68.3 kg, SpO2 94%.   Exam: Gen Exam: Frail-agitated but not in distress. HEENT:atraumatic, normocephalic Chest: B/L clear to auscultation anteriorly CVS:S1S2 regular Abdomen:soft non tender, non distended Extremities:no edema Neurology: Difficult exam-but seems to be moving all 4 extremities. Skin: no rash  Pertinent Labs/Radiology:    Latest Ref Rng & Units 09/07/2024    3:46 AM 09/06/2024   11:51 PM 09/06/2024   11:28 PM  CBC  WBC 4.0 -  10.5 K/uL 9.0  7.0    Hemoglobin 13.0 - 17.0 g/dL 7.9  9.4  89.7    89.7   Hematocrit 39.0 - 52.0 % 24.6  28.8  30.0    30.0   Platelets 150 - 400 K/uL 123  143      Lab Results  Component Value Date   NA 140 09/08/2024   K 5.8 (H) 09/08/2024   CL 108 09/08/2024   CO2 21 (L) 09/08/2024      Assessment/Plan: Sepsis secondary to pneumonia Sepsis physiology improving Continue empiric antibiotics Culture data negative so far-see above  Acute metabolic encephalopathy Secondary to above Still very confused/not really orientable Likely has some chronic cognitive issues at baseline as well Continue to underlying sepsis physiology Delirium precautions Continue Seroquel  Allow time for clinical outcomes.  Bradycardia/hypothermia Secondary to sepsis physiology Improving Telemetry monitoring Warming blanket as needed.  Hyperkalemia Likely secondary to CKD 3 Start scheduled Lokelma  through NG tube Recheck electrolytes tomorrow  Oropharyngeal dysphagia Probably chronic but worsened due to acute illness/encephalopathy Has Cortrak tube in place Once more awake/alert needs SLP eval.  CKD stage IIIa Close to baseline  Normocytic anemia Probably combination of acute illness/CKD-worsened by acute illness/IV fluid dilution No evidence of blood loss CBC not done this morning-I have ordered stat. Follow closely for now  Thrombocytopenia Mild Await repeat CBC  Chronic atrial fibrillation Heart rate fluctuating from mild bradycardia to normal Hold all rate control agents Eliquis on hold due to severity of anemia-awaiting repeat CBC this morning.  Hypothyroidism TSH slightly elevated Continue levothyroxine  but will will increase dose to  75 mcg given issues with bradycardia.  History of prostate cancer Hold Xtandi  Resume when able  BPH Resume Flomax .  B/L lower extremity edema Dopplers negative  Palliative care Reviewed prior notes from [facility] note DNR Will  allow some time for clinical outcomes but obviously patient does not seem to be a candidate for any further escalation in care.  If he does not improve or deteriorates-suspect best served by transitioning to comfort measures.  Called patient's son Careers adviser.  Await formal palliative care evaluation.  Nutrition Status: Nutrition Problem: Inadequate oral intake Etiology: dysphagia, lethargy/confusion Signs/Symptoms: NPO status Interventions: Refer to RD note for recommendations  Pressure Ulcer: Agree with assessment as outlined below Wound 09/06/24 2300 Pressure Injury Heel Left Stage 3 -  Full thickness tissue loss. Subcutaneous fat may be visible but bone, tendon or muscle are NOT exposed. (Active)     Wound 09/06/24 2300 Pressure Injury Sacrum Medial (Active)   Code status:   Code Status: Limited: Do not attempt resuscitation (DNR) -DNR-LIMITED -Do Not Intubate/DNI    DVT Prophylaxis: heparin  injection 5,000 Units Start: 09/07/24 1400 SCDs Start: 09/07/24 0331   Family Communication: Son-Chris [phone] 1/10 on 1/10   Disposition Plan: Status is: Inpatient Remains inpatient appropriate because: Severity of illness   Planned Discharge Destination:Skilled nursing facility   Diet: Diet Order             Diet NPO time specified  Diet effective now                     Antimicrobial agents: Anti-infectives (From admission, onward)    Start     Dose/Rate Route Frequency Ordered Stop   09/08/24 1800  vancomycin  (VANCOREADY) IVPB 1250 mg/250 mL  Status:  Discontinued        1,250 mg 166.7 mL/hr over 90 Minutes Intravenous Every 48 hours 09/07/24 0356 09/07/24 0948   09/07/24 1000  ceFEPIme  (MAXIPIME ) 2 g in sodium chloride  0.9 % 100 mL IVPB  Status:  Discontinued        2 g 200 mL/hr over 30 Minutes Intravenous Every 12 hours 09/07/24 0356 09/07/24 0948   09/07/24 1000  cefTRIAXone  (ROCEPHIN ) 2 g in sodium chloride  0.9 % 100 mL IVPB        2 g 200 mL/hr  over 30 Minutes Intravenous Daily 09/07/24 0948     09/07/24 0315  azithromycin  (ZITHROMAX ) 500 mg in sodium chloride  0.9 % 250 mL IVPB        500 mg 250 mL/hr over 60 Minutes Intravenous Every 24 hours 09/07/24 0309 09/09/24 2159   09/06/24 2330  ceFEPIme  (MAXIPIME ) 2 g in sodium chloride  0.9 % 100 mL IVPB        2 g 200 mL/hr over 30 Minutes Intravenous  Once 09/06/24 2321 09/07/24 0041   09/06/24 2330  metroNIDAZOLE  (FLAGYL ) IVPB 500 mg        500 mg 100 mL/hr over 60 Minutes Intravenous  Once 09/06/24 2321 09/07/24 0227   09/06/24 2330  vancomycin  (VANCOCIN ) IVPB 1000 mg/200 mL premix        1,000 mg 200 mL/hr over 60 Minutes Intravenous  Once 09/06/24 2321 09/07/24 0333        MEDICATIONS: Scheduled Meds:  Chlorhexidine  Gluconate Cloth  6 each Topical Daily   heparin   5,000 Units Subcutaneous Q8H   QUEtiapine   25 mg Per Tube BID   sodium zirconium cyclosilicate   10 g Per Tube TID   thiamine   100 mg Per  Tube Daily   Continuous Infusions:  azithromycin  Stopped (09/07/24 2354)   cefTRIAXone  (ROCEPHIN )  IV Stopped (09/07/24 1110)   feeding supplement (OSMOLITE 1.2 CAL) 25 mL/hr at 09/08/24 0000   PRN Meds:.atropine , haloperidol  lactate, polyethylene glycol, senna   I have personally reviewed following labs and imaging studies  LABORATORY DATA: CBC: Recent Labs  Lab 09/06/24 2328 09/06/24 2351 09/07/24 0346  WBC  --  7.0 9.0  NEUTROABS  --  5.7  --   HGB 10.2*  10.2* 9.4* 7.9*  HCT 30.0*  30.0* 28.8* 24.6*  MCV  --  94.1 95.3  PLT  --  143* 123*    Basic Metabolic Panel: Recent Labs  Lab 09/06/24 2351 09/07/24 0254 09/07/24 0346 09/07/24 1753 09/07/24 2356 09/08/24 0339  NA 140  --  139  139 139 140 140  K 6.0*  --  6.1*  6.0* 6.3* 5.5* 5.8*  CL 109  --  110  110 109 109 108  CO2 22  --  19*  19* 20* 21* 21*  GLUCOSE 132*  --  148*  148* 76 64* 106*  BUN 20  --  20  20 22  26* 24*  CREATININE 1.34* 1.27* 1.31*  1.35* 1.50* 1.58* 1.66*   CALCIUM  9.7  --  9.4  9.6 9.1 9.0 9.5  MG  --   --  1.6*  --   --  2.1  PHOS  --   --  2.8  --   --  3.7    GFR: Estimated Creatinine Clearance: 27.4 mL/min (A) (by C-G formula based on SCr of 1.66 mg/dL (H)).  Liver Function Tests: Recent Labs  Lab 09/06/24 2351  AST 39  ALT 24  ALKPHOS 99  BILITOT 0.5  PROT 5.8*  ALBUMIN 3.6   No results for input(s): LIPASE, AMYLASE in the last 168 hours. Recent Labs  Lab 09/07/24 0235  AMMONIA 27    Coagulation Profile: Recent Labs  Lab 09/06/24 2351  INR 1.3*    Cardiac Enzymes: No results for input(s): CKTOTAL, CKMB, CKMBINDEX, TROPONINI in the last 168 hours.  BNP (last 3 results) Recent Labs    09/06/24 2351  PROBNP 611.0*    Lipid Profile: No results for input(s): CHOL, HDL, LDLCALC, TRIG, CHOLHDL, LDLDIRECT in the last 72 hours.  Thyroid  Function Tests: Recent Labs    09/06/24 2351  TSH 10.500*  FREET4 1.12    Anemia Panel: No results for input(s): VITAMINB12, FOLATE, FERRITIN, TIBC, IRON, RETICCTPCT in the last 72 hours.  Urine analysis:    Component Value Date/Time   COLORURINE YELLOW 09/07/2024 0003   APPEARANCEUR CLEAR 09/07/2024 0003   LABSPEC 1.020 09/07/2024 0003   PHURINE 7.0 09/07/2024 0003   GLUCOSEU NEGATIVE 09/07/2024 0003   HGBUR MODERATE (A) 09/07/2024 0003   BILIRUBINUR NEGATIVE 09/07/2024 0003   KETONESUR NEGATIVE 09/07/2024 0003   PROTEINUR NEGATIVE 09/07/2024 0003   NITRITE NEGATIVE 09/07/2024 0003   LEUKOCYTESUR NEGATIVE 09/07/2024 0003    Sepsis Labs: Lactic Acid, Venous    Component Value Date/Time   LATICACIDVEN 1.9 09/06/2024 2332    MICROBIOLOGY: Recent Results (from the past 240 hours)  Resp panel by RT-PCR (RSV, Flu A&B, Covid) Urine, Clean Catch     Status: None   Collection Time: 09/06/24 11:51 PM   Specimen: Urine, Clean Catch; Nasal Swab  Result Value Ref Range Status   SARS Coronavirus 2 by RT PCR NEGATIVE NEGATIVE  Final   Influenza A by PCR NEGATIVE NEGATIVE Final  Influenza B by PCR NEGATIVE NEGATIVE Final    Comment: (NOTE) The Xpert Xpress SARS-CoV-2/FLU/RSV plus assay is intended as an aid in the diagnosis of influenza from Nasopharyngeal swab specimens and should not be used as a sole basis for treatment. Nasal washings and aspirates are unacceptable for Xpert Xpress SARS-CoV-2/FLU/RSV testing.  Fact Sheet for Patients: bloggercourse.com  Fact Sheet for Healthcare Providers: seriousbroker.it  This test is not yet approved or cleared by the [place]  FDA and has been authorized for detection and/or diagnosis of SARS-CoV-2 by FDA under an Emergency Use Authorization (EUA). This EUA will remain in effect (meaning this test can be used) for the duration of the COVID-19 declaration under Section 564(b)(1) of the Act, 21 U.S.C. section 360bbb-3(b)(1), unless the authorization is terminated or revoked.     Resp Syncytial Virus by PCR NEGATIVE NEGATIVE Final    Comment: (NOTE) Fact Sheet for Patients: bloggercourse.com  Fact Sheet for Healthcare Providers: seriousbroker.it  This test is not yet approved or cleared by the [place]  FDA and has been authorized for detection and/or diagnosis of SARS-CoV-2 by FDA under an Emergency Use Authorization (EUA). This EUA will remain in effect (meaning this test can be used) for the duration of the COVID-19 declaration under Section 564(b)(1) of the Act, 21 U.S.C. section 360bbb-3(b)(1), unless the authorization is terminated or revoked.  Performed at [facility], [address] [place], [place] [address]   Urine Culture     Status: Abnormal   Collection Time: 09/07/24 12:03 AM   Specimen: Urine, Catheterized  Result Value Ref Range Status   Specimen Description URINE, CATHETERIZED  Final   Special Requests NONE  Final    Culture (A)  Final    <10,000 COLONIES/mL INSIGNIFICANT GROWTH Performed at [facility], [address] [place], [place] [address]    Report Status 09/07/2024 FINAL  Final  Blood Culture (routine x 2)     Status: None (Preliminary result)   Collection Time: 09/07/24 12:08 AM   Specimen: BLOOD LEFT ARM  Result Value Ref Range Status   Specimen Description BLOOD LEFT ARM  Final   Special Requests   Final    BOTTLES DRAWN AEROBIC AND ANAEROBIC Blood Culture adequate volume   Culture   Final    NO GROWTH 1 DAY Performed at [facility], [address] [place], [place] [address]    Report Status PENDING  Incomplete  Blood Culture (routine x 2)     Status: None (Preliminary result)   Collection Time: 09/07/24 12:10 AM   Specimen: BLOOD LEFT HAND  Result Value Ref Range Status   Specimen Description BLOOD LEFT HAND  Final   Special Requests   Final    BOTTLES DRAWN AEROBIC AND ANAEROBIC Blood Culture adequate volume   Culture   Final    NO GROWTH 1 DAY Performed at [facility], [address] [place], [place] [address]    Report Status PENDING  Incomplete  Respiratory (~20 pathogens) panel by PCR     Status: None   Collection Time: 09/07/24  3:07 AM   Specimen: Urine, Clean Catch; Respiratory  Result Value Ref Range Status   Adenovirus NOT DETECTED NOT DETECTED Final   Coronavirus 229E NOT DETECTED NOT DETECTED Final    Comment: (NOTE) The Coronavirus on the Respiratory Panel, DOES NOT test for the novel  Coronavirus (2019 nCoV)    Coronavirus HKU1 NOT DETECTED NOT DETECTED Final   Coronavirus  NL63 NOT DETECTED NOT DETECTED Final   Coronavirus OC43 NOT DETECTED NOT DETECTED Final   Metapneumovirus NOT DETECTED NOT DETECTED Final   Rhinovirus / Enterovirus NOT DETECTED NOT DETECTED Final   Influenza A NOT DETECTED NOT DETECTED Final   Influenza B NOT DETECTED NOT DETECTED Final   Parainfluenza Virus 1 NOT DETECTED NOT DETECTED Final   Parainfluenza Virus  2 NOT DETECTED NOT DETECTED Final   Parainfluenza Virus 3 NOT DETECTED NOT DETECTED Final   Parainfluenza Virus 4 NOT DETECTED NOT DETECTED Final   Respiratory Syncytial Virus NOT DETECTED NOT DETECTED Final   Bordetella pertussis NOT DETECTED NOT DETECTED Final   Bordetella Parapertussis NOT DETECTED NOT DETECTED Final   Chlamydophila pneumoniae NOT DETECTED NOT DETECTED Final   Mycoplasma pneumoniae NOT DETECTED NOT DETECTED Final    Comment: Performed at [facility], [address] [place], [place] [address]  MRSA Next Gen by PCR, Nasal     Status: None   Collection Time: 09/07/24  3:46 AM   Specimen: Nasal Mucosa; Nasal Swab  Result Value Ref Range Status   MRSA by PCR Next Gen NOT DETECTED NOT DETECTED Final    Comment: (NOTE) The GeneXpert MRSA Assay (FDA approved for NASAL specimens only), is one component of a comprehensive MRSA colonization surveillance program. It is not intended to diagnose MRSA infection nor to guide or monitor treatment for MRSA infections. Test performance is not FDA approved in patients less than 37 years old. Performed at [facility], [address], [place], [place] [address]     RADIOLOGY STUDIES/RESULTS: VAS US  LOWER EXTREMITY VENOUS (DVT) Result Date: 09/08/2024  Lower Venous DVT Study Patient Name:  Justin Hull  Date of Exam:   09/07/2024 Medical Rec #: [id number]    Accession #:    [id number] Date of Birth: [birth year]     Patient Gender: M Patient Age:   35 years Exam Location:  [facility] Procedure:      VAS US  LOWER EXTREMITY VENOUS (DVT) Referring Phys: LEITA GASKINS --------------------------------------------------------------------------------  Indications: Edema.  Limitations: Combative and uncooperative patient. Comparison Study: No prior exam. Performing Technologist: Edilia Elden Appl  Examination Guidelines: A complete evaluation includes B-mode imaging, spectral Doppler, color Doppler, and power Doppler as needed of all  accessible portions of each vessel. Bilateral testing is considered an integral part of a complete examination. Limited examinations for reoccurring indications may be performed as noted. The reflux portion of the exam is performed with the patient in reverse Trendelenburg.  +---------+---------------+---------+-----------+----------+--------------+ RIGHT    CompressibilityPhasicitySpontaneityPropertiesThrombus Aging +---------+---------------+---------+-----------+----------+--------------+ CFV      Full           Yes      Yes                                 +---------+---------------+---------+-----------+----------+--------------+ SFJ      Full           Yes      Yes                                 +---------+---------------+---------+-----------+----------+--------------+ FV Prox  Full                                                        +---------+---------------+---------+-----------+----------+--------------+  FV Mid   Full                                                        +---------+---------------+---------+-----------+----------+--------------+ FV DistalFull                                                        +---------+---------------+---------+-----------+----------+--------------+ PFV      Full                                                        +---------+---------------+---------+-----------+----------+--------------+ POP      Full                                                        +---------+---------------+---------+-----------+----------+--------------+ PTV      Full                                                        +---------+---------------+---------+-----------+----------+--------------+ PERO     Full                                                        +---------+---------------+---------+-----------+----------+--------------+ Limited exam due to patient being combative. Study mostly comprises of  compression images with limited doppler images. Best obtainable images.  +---------+---------------+---------+-----------+----------+--------------+ LEFT     CompressibilityPhasicitySpontaneityPropertiesThrombus Aging +---------+---------------+---------+-----------+----------+--------------+ CFV      Full           Yes      Yes                                 +---------+---------------+---------+-----------+----------+--------------+ SFJ      Full                                                        +---------+---------------+---------+-----------+----------+--------------+ FV Prox  Full                                                        +---------+---------------+---------+-----------+----------+--------------+ FV Mid   Full                                                        +---------+---------------+---------+-----------+----------+--------------+  FV DistalFull                                                        +---------+---------------+---------+-----------+----------+--------------+ PFV      Full                                                        +---------+---------------+---------+-----------+----------+--------------+ POP      Full                                                        +---------+---------------+---------+-----------+----------+--------------+ PTV      Full                                                        +---------+---------------+---------+-----------+----------+--------------+ PERO     Full                                                        +---------+---------------+---------+-----------+----------+--------------+ Limited exam due to patient being combative. Study mostly comprises of compression images with limited doppler images. Best obtainable images.    Summary: RIGHT: - There is no evidence of deep vein thrombosis in the lower extremity. However, portions of this examination were  limited- see technologist comments above.  - No cystic structure found in the popliteal fossa.  LEFT: - There is no evidence of deep vein thrombosis in the lower extremity. However, portions of this examination were limited- see technologist comments above.  - No cystic structure found in the popliteal fossa.  *See table(s) above for measurements and observations. Electronically signed by Fonda Rim on 09/08/2024 at 8:36:19 AM.    Final    DG Abd Portable 1V Result Date: 09/07/2024 CLINICAL DATA:  Feeding tube placement EXAM: PORTABLE ABDOMEN - 1 VIEW COMPARISON:  None Available. FINDINGS: Distal tip of feeding tube is seen in expected position of proximal stomach. IMPRESSION: Distal tip of feeding tube seen in expected position of proximal stomach. Electronically Signed   By: Lynwood Landy Raddle M.D.   On: 09/07/2024 12:04   CT HEAD WO CONTRAST Result Date: 09/07/2024 EXAM: CT HEAD WITHOUT CONTRAST 09/07/2024 12:56:13 AM TECHNIQUE: CT of the head was performed without the administration of intravenous contrast. Automated exposure control, iterative reconstruction, and/or weight based adjustment of the mA/kV was utilized to reduce the radiation dose to as low as reasonably achievable. COMPARISON: 07/28/2024 CLINICAL HISTORY: Delirium. FINDINGS: BRAIN AND VENTRICLES: No acute hemorrhage. No evidence of acute infarct. No hydrocephalus. No extra-axial collection. No mass effect or midline shift. Atrophy and chronic small vessel disease throughout the deep white matter. ORBITS: No acute abnormality. SINUSES: No acute abnormality. SOFT TISSUES AND  SKULL: No acute soft tissue abnormality. No skull fracture. IMPRESSION: 1. No acute intracranial abnormality. 2. Atrophy and chronic small vessel ischemic disease in the deep white matter. Electronically signed by: Franky Crease MD MD 09/07/2024 12:59 AM EST RP Workstation: [id number]   DG Chest Port 1 View Result Date: 09/06/2024 EXAM: 1 VIEW XRAY OF THE CHEST 09/06/2024  11:36:24 PM COMPARISON: 07/28/2024 CLINICAL HISTORY: weakness/altered FINDINGS: LUNGS AND PLEURA: Bilateral lower lobe airspace opacities could reflect edema or infection. No pleural effusion. No pneumothorax. HEART AND MEDIASTINUM: Aortic atherosclerosis. No acute abnormality of the cardiac and mediastinal silhouettes. BONES AND SOFT TISSUES: No acute osseous abnormality. IMPRESSION: 1. Bilateral lower lobe airspace opacities, possibly representing edema or infection. Electronically signed by: Franky Crease MD MD 09/06/2024 11:40 PM EST RP Workstation: [id number]     LOS: 1 day   Donalda Applebaum, MD  [facility]    To contact the attending provider between 7A-7P or the covering provider during after hours 7P-7A, please log into the web site [email or web address] and access using universal [place] password for that web site. If you do not have the password, please call the hospital operator.  09/08/2024, 9:03 AM    "

## 2024-09-08 NOTE — TOC Transition Note (Signed)
 Transition of Care [facility]) - Discharge Note   Patient Details  Name: Justin Hull MRN: [id number] Date of Birth: Nov 18, 1929  [facility]) CM/SW Contact:  Gwenn Julien Norris, LCSW Phone Number: 09/08/2024, 4:46 PM   Clinical Narrative:  Pt has been accepted to [facility]. Per Cheri in admissions, pt's sons have completed consents and are agreeable to dc. RN provided with number for report [phone] and [facility] arranged for transport. SW signing off at dc.   Julien Gwenn, MSW, LCSW [phone] (coverage)      Final next level of care: Hospice Medical Facility Barriers to Discharge: Barriers Resolved   Patient Goals and CMS Choice            Discharge Placement                Patient to be transferred to facility by: PT[facility] Name of family member notified: Chris/son Patient and family notified of of transfer: 09/08/24  Discharge Plan and Services Additional resources added to the After Visit Summary for                              [facility] Agency: [facility] Date  Hospital Agency Contacted: 09/08/24 Time HH Agency Contacted: 1515 Representative spoke with at [facility] Agency: [facility]  Social Drivers of Health (SDOH) Interventions SDOH Screenings   Food Insecurity: Patient Unable To Answer (09/08/2024)  Housing: Patient Unable To Answer (09/08/2024)  Transportation Needs: Patient Unable To Answer (09/08/2024)  Utilities: Patient Unable To Answer (09/08/2024)  Depression (PHQ2-9): Low Risk (06/25/2024)  Social Connections: Patient Unable To Answer (09/08/2024)  Tobacco Use: Medium Risk (09/07/2024)     Readmission Risk Interventions     No data to display

## 2024-09-08 NOTE — Plan of Care (Signed)

## 2024-09-08 NOTE — Progress Notes (Signed)
 Continuous Dilaudid  of 50 mg in 50 ml was stopped at 1800 and wasted 42 ml/mg with Lulu RN, pt left with PTAR.

## 2024-09-08 NOTE — TOC Initial Note (Signed)
 Transition of Care [facility]) - Initial/Assessment Note    Patient Details  Name: Justin Hull MRN: [id number] Date of Birth: 04-Aug-1930  Transition [facility]) CM/SW Contact:    Marval Gell, RN Phone Number: 09/08/2024, 3:39 PM  Clinical Narrative:                  Notified by MD that patient's son would like [facility] [place], for inpatient hospice. Referral given to Cheri who will call son.   Expected Discharge Plan: Hospice Medical Facility Barriers to Discharge: No Barriers Identified   Patient Goals and CMS Choice            Expected Discharge Plan and Services                                     [facility] Agency: [facility] Date [facility] Agency Contacted: 09/08/24 Time HH Agency Contacted: 1515 Representative spoke with at [facility] Agency: [facility]  Prior Living Arrangements/Services                       Activities of Daily Living      Permission Sought/Granted                  Emotional Assessment              Admission diagnosis:  Hyperkalemia [E87.5] Delirium [R41.0] Atrial fibrillation with slow ventricular response (HCC) [I48.91] Hypothermia, initial encounter [T68.XXXA] Sepsis [facility]) [A41.9] Patient Active Problem List   Diagnosis Date Noted   Sepsis (HCC) 09/07/2024   Preoperative cardiovascular examination 08/15/2024   Chronic diastolic CHF (congestive heart failure) (HCC) 08/15/2024   Prostate cancer (HCC) 05/14/2024   Atrial fibrillation (HCC) 03/13/2015   Essential hypertension 03/13/2015   PCP:  Street, Lonni HERO, MD Pharmacy:   [facility] - [place], [place] - [address] [place] [address] Phone: [phone] Fax: [phone]  [facility] [address] [place] [address] Phone: [phone] Fax: [phone]  [facility] - [place], [place] - [place] [address] [place] [address] Phone: [phone] Fax: [phone]     Social Drivers of Health (SDOH) Social History: SDOH Screenings   Food Insecurity: Patient Unable To Answer (09/08/2024)  Housing: Patient Unable To Answer (09/08/2024)  Transportation Needs: Patient Unable To Answer (09/08/2024)  Utilities: Patient Unable To Answer (09/08/2024)  Depression (PHQ2-9): Low Risk (06/25/2024)  Social Connections: Patient Unable To Answer (09/08/2024)  Tobacco Use: Medium Risk (09/07/2024)   SDOH Interventions:     Readmission Risk Interventions     No data to display

## 2024-09-08 NOTE — Progress Notes (Signed)
 Brief note Both sons at bedside-I spoke with Medford over the phone-both he and his brother have decided to transition to full comfort care/end-of-life care.  Family understands that we will stop all medications including antibiotics-no further blood work and focus on keeping the patient comfortable.  He has been very restless overnight-hence we will place him on a Dilaudid  infusion and as needed Haldol /Ativan  as well.  Family wants him closer to home-Will get him evaluated for residential hospice-in [place].  Have consulted TOC team to see if we can get him to residential hospice later today.

## 2024-09-10 ENCOUNTER — Other Ambulatory Visit: Payer: Self-pay

## 2024-09-10 NOTE — Progress Notes (Signed)
 Per discharge summary patient transitioned to comfort care and Xtandi  discontinued, disenrolled.

## 2024-09-12 LAB — CULTURE, BLOOD (ROUTINE X 2)
Culture: NO GROWTH
Culture: NO GROWTH
Special Requests: ADEQUATE
Special Requests: ADEQUATE

## 2024-09-14 ENCOUNTER — Inpatient Hospital Stay

## 2024-09-14 ENCOUNTER — Inpatient Hospital Stay: Attending: Oncology

## 2024-09-17 ENCOUNTER — Inpatient Hospital Stay: Admitting: Oncology

## 2024-09-17 ENCOUNTER — Inpatient Hospital Stay

## 2024-09-30 DEATH — deceased

## 2024-10-17 ENCOUNTER — Ambulatory Visit
